# Patient Record
Sex: Female | Born: 1955 | Race: Black or African American | Hispanic: No | Smoking: Never smoker
Health system: Southern US, Community
[De-identification: ages and names within clinical notes are randomized; demographics above are authoritative.]

## PROBLEM LIST (undated history)

## (undated) DIAGNOSIS — D649 Anemia, unspecified: Secondary | ICD-10-CM

## (undated) DIAGNOSIS — T7840XA Allergy, unspecified, initial encounter: Secondary | ICD-10-CM

## (undated) DIAGNOSIS — I10 Essential (primary) hypertension: Secondary | ICD-10-CM

## (undated) DIAGNOSIS — K5900 Constipation, unspecified: Secondary | ICD-10-CM

## (undated) DIAGNOSIS — M1990 Unspecified osteoarthritis, unspecified site: Secondary | ICD-10-CM

## (undated) DIAGNOSIS — K219 Gastro-esophageal reflux disease without esophagitis: Secondary | ICD-10-CM

## (undated) HISTORY — PX: FOOT SURGERY: SHX648

## (undated) HISTORY — PX: TUBAL LIGATION: SHX77

## (undated) HISTORY — DX: Constipation, unspecified: K59.00

## (undated) HISTORY — PX: ABDOMINAL HYSTERECTOMY: SHX81

## (undated) HISTORY — PX: TONSILLECTOMY: SUR1361

## (undated) HISTORY — PX: COLONOSCOPY: SHX174

## (undated) HISTORY — DX: Gastro-esophageal reflux disease without esophagitis: K21.9

## (undated) HISTORY — PX: UPPER GASTROINTESTINAL ENDOSCOPY: SHX188

## (undated) HISTORY — DX: Unspecified osteoarthritis, unspecified site: M19.90

## (undated) HISTORY — DX: Anemia, unspecified: D64.9

## (undated) HISTORY — DX: Allergy, unspecified, initial encounter: T78.40XA

## (undated) HISTORY — DX: Essential (primary) hypertension: I10

---

## 2007-05-03 ENCOUNTER — Ambulatory Visit: Payer: Self-pay | Admitting: Internal Medicine

## 2007-05-07 ENCOUNTER — Ambulatory Visit: Payer: Self-pay | Admitting: *Deleted

## 2007-06-25 ENCOUNTER — Ambulatory Visit: Payer: Self-pay | Admitting: Internal Medicine

## 2007-07-16 ENCOUNTER — Ambulatory Visit: Payer: Self-pay | Admitting: Internal Medicine

## 2007-07-16 LAB — CONVERTED CEMR LAB
Amphetamine Screen, Ur: NEGATIVE
Benzodiazepines.: NEGATIVE
CO2: 22 meq/L (ref 19–32)
Calcium: 9 mg/dL (ref 8.4–10.5)
Chloride: 105 meq/L (ref 96–112)
Cholesterol: 144 mg/dL (ref 0–200)
Cocaine Metabolites: NEGATIVE
Creatinine, Ser: 0.86 mg/dL (ref 0.40–1.20)
Eosinophils Relative: 2 % (ref 0–5)
Glucose, Bld: 100 mg/dL — ABNORMAL HIGH (ref 70–99)
HCT: 39.7 % (ref 36.0–46.0)
Hemoglobin: 13.4 g/dL (ref 12.0–15.0)
Lymphocytes Relative: 18 % (ref 12–46)
Lymphs Abs: 1.8 10*3/uL (ref 0.7–4.0)
Marijuana Metabolite: NEGATIVE
Methadone: NEGATIVE
Monocytes Absolute: 0.6 10*3/uL (ref 0.1–1.0)
Neutro Abs: 7.6 10*3/uL (ref 1.7–7.7)
Sodium: 138 meq/L (ref 135–145)
Total Bilirubin: 0.7 mg/dL (ref 0.3–1.2)
Total Protein: 7.4 g/dL (ref 6.0–8.3)
Triglycerides: 80 mg/dL (ref <150)
VLDL: 16 mg/dL (ref 0–40)
WBC: 10.2 10*3/uL (ref 4.0–10.5)

## 2007-07-30 ENCOUNTER — Ambulatory Visit: Payer: Self-pay | Admitting: Family Medicine

## 2007-07-30 LAB — CONVERTED CEMR LAB
T3, Total: 127 ng/dL (ref 80.0–204.0)
T4, Total: 6.4 ug/dL (ref 5.0–12.5)
TSH: 2.406 microintl units/mL (ref 0.350–5.50)

## 2012-09-11 ENCOUNTER — Ambulatory Visit (HOSPITAL_COMMUNITY)
Admission: RE | Admit: 2012-09-11 | Discharge: 2012-09-11 | Disposition: A | Discharge status: Home / Self Care | Payer: No Typology Code available for payment source | Source: Ambulatory Visit | Attending: Internal Medicine | Admitting: Internal Medicine

## 2012-09-11 ENCOUNTER — Ambulatory Visit: Payer: No Typology Code available for payment source | Attending: Family Medicine | Admitting: Internal Medicine

## 2012-09-11 VITALS — BP 144/81 | HR 61 | Temp 97.9°F | Resp 18 | Wt 184.4 lb

## 2012-09-11 DIAGNOSIS — R05 Cough: Secondary | ICD-10-CM

## 2012-09-11 DIAGNOSIS — R053 Chronic cough: Secondary | ICD-10-CM | POA: Insufficient documentation

## 2012-09-11 DIAGNOSIS — R059 Cough, unspecified: Secondary | ICD-10-CM

## 2012-09-11 DIAGNOSIS — R0602 Shortness of breath: Secondary | ICD-10-CM | POA: Insufficient documentation

## 2012-09-11 DIAGNOSIS — M659 Synovitis and tenosynovitis, unspecified: Secondary | ICD-10-CM

## 2012-09-11 DIAGNOSIS — R609 Edema, unspecified: Secondary | ICD-10-CM

## 2012-09-11 DIAGNOSIS — R600 Localized edema: Secondary | ICD-10-CM

## 2012-09-11 DIAGNOSIS — M6580 Other synovitis and tenosynovitis, unspecified site: Secondary | ICD-10-CM

## 2012-09-11 HISTORY — DX: Chronic cough: R05.3

## 2012-09-11 HISTORY — DX: Localized edema: R60.0

## 2012-09-11 HISTORY — DX: Edema, unspecified: R60.9

## 2012-09-11 LAB — CBC WITH DIFFERENTIAL/PLATELET
Basophils Absolute: 0 10*3/uL (ref 0.0–0.1)
Eosinophils Relative: 1 % (ref 0–5)
Lymphocytes Relative: 33 % (ref 12–46)
Lymphs Abs: 2.1 10*3/uL (ref 0.7–4.0)
MCV: 79.2 fL (ref 78.0–100.0)
Neutro Abs: 3.8 10*3/uL (ref 1.7–7.7)
Neutrophils Relative %: 59 % (ref 43–77)
Platelets: 337 10*3/uL (ref 150–400)
RBC: 4.77 MIL/uL (ref 3.87–5.11)
RDW: 12.4 % (ref 11.5–15.5)
WBC: 6.4 10*3/uL (ref 4.0–10.5)

## 2012-09-11 LAB — COMPREHENSIVE METABOLIC PANEL
ALT: 30 U/L (ref 0–35)
AST: 24 U/L (ref 0–37)
CO2: 24 mEq/L (ref 19–32)
Calcium: 9.2 mg/dL (ref 8.4–10.5)
Chloride: 106 mEq/L (ref 96–112)
Creat: 0.95 mg/dL (ref 0.50–1.10)
Sodium: 138 mEq/L (ref 135–145)
Total Protein: 7.4 g/dL (ref 6.0–8.3)

## 2012-09-11 LAB — TSH: TSH: 2.51 u[IU]/mL (ref 0.350–4.500)

## 2012-09-11 MED ORDER — FUROSEMIDE 40 MG PO TABS: 40.0000 mg | ORAL_TABLET | Freq: Every day | ORAL | Status: DC

## 2012-09-11 MED ORDER — AZITHROMYCIN 250 MG PO TABS: ORAL_TABLET | ORAL | Status: DC

## 2012-09-11 MED ORDER — MELOXICAM 15 MG PO TABS: 15.0000 mg | ORAL_TABLET | Freq: Every day | ORAL | Status: DC

## 2012-09-11 NOTE — Progress Notes (Signed)
  Subjective:    Patient ID: Lindsey Suarez, female    DOB: [birth year], 57 y.o.   MRN: [id number]  HPI  57 year old female who [facility]. The patient has multiple complaints. Firstly the patient has had a cough since January. She has been taking over-the-counter Mucinex and delsom for her symptoms. The patient also complains of bilateral lower extremity swelling and has gained about 15 pounds since October of last year. The patient was being 169 in October of last yesterday weighs 184 pounds. Patient denies any chest pain but does complain of dyspnea on exertion. She also complains of elbow pain in her right arm. The patient states that any rotation of the  elbow elicits sharp shooting pain along the lateral aspect of her right forearm.  Review of Systems As in history of present illness  Past medical history Chronic sinusitis  Past surgical history Tubal ligation Vaginal hysterectomy  Social history Nonfocal, drinks occasionally unemployed  Family history Mother has breast cancer Aunt has breast cancer      Objective:   Physical Exam  General: NAD, resting comfortably in bed  Eyes: PEERLA EOMI  ENT: mucous membranes moist, patient very hard of hearing  Neck: supple w/o JVD  Cardiovascular: RRR w/o MRG  Respiratory: CTA B  Abdomen: soft, nt, nd, bs+  Skin: no rash nor lesion  Musculoskeletal: MAE, full ROM all 4 extremities  Psychiatric: normal tone and affect  Neurologic: AAOx3, grossly non-focal         Assessment & Plan:  Shortness of breath, peripheral edema-this is a new diagnosis we will obtain a chest x-ray, BNP, BMP, start the patient on Lasix 40 mg a day and have the patient follow up in 2 weeks for repeat BMP We'll also refer the patient for a 2-D echo  Family history of breast cancer we'll schedule the patient for a mammogram   Chronic cough could be related to volume overload, and empirically prescribed  a five-day course of Z-Pak  Right elbow tenosynovitis Prescribed the patient meloxicam

## 2012-09-11 NOTE — Progress Notes (Signed)
Patient here to establish care Has had a cough since december

## 2012-09-12 LAB — PRO B NATRIURETIC PEPTIDE: Pro B Natriuretic peptide (BNP): 26.35 pg/mL (ref <126)

## 2012-09-16 ENCOUNTER — Encounter: Payer: Self-pay | Admitting: Family Medicine

## 2012-09-17 ENCOUNTER — Telehealth: Payer: Self-pay | Admitting: *Deleted

## 2012-09-25 ENCOUNTER — Ambulatory Visit: Payer: No Typology Code available for payment source | Attending: Family Medicine | Admitting: Internal Medicine

## 2012-09-25 VITALS — BP 142/90 | HR 81 | Temp 98.6°F | Resp 16 | Ht 62.0 in | Wt 182.5 lb

## 2012-09-25 DIAGNOSIS — M6580 Other synovitis and tenosynovitis, unspecified site: Secondary | ICD-10-CM | POA: Insufficient documentation

## 2012-09-25 DIAGNOSIS — R609 Edema, unspecified: Secondary | ICD-10-CM | POA: Insufficient documentation

## 2012-09-25 DIAGNOSIS — R600 Localized edema: Secondary | ICD-10-CM

## 2012-09-25 DIAGNOSIS — R059 Cough, unspecified: Secondary | ICD-10-CM | POA: Insufficient documentation

## 2012-09-25 DIAGNOSIS — R05 Cough: Secondary | ICD-10-CM

## 2012-09-25 MED ORDER — GUAIFENESIN ER 600 MG PO TB12: 1200.0000 mg | ORAL_TABLET | Freq: Two times a day (BID) | ORAL | Status: DC | PRN

## 2012-09-25 NOTE — Patient Instructions (Signed)

## 2012-09-25 NOTE — Progress Notes (Signed)
Pt is here for a follow up and needing refills on meds Also c/o persistent dry cough w/occasioanl clear phlegm and fluid build up She is alert and oriented w/no signs of acute distress.... Rmeza, CMA

## 2012-09-25 NOTE — Progress Notes (Signed)
Patient ID: Lindsey Suarez, female   DOB: [birth year], 56 y.o.   MRN: [id number]  CC: Followup visit  HPI: 57 year old female with past medical history for arthritis and lower extremity edema, chronic cough who presents to the clinic for followup and review of blood tests. Patient has chronic cough and was on a Z-Pak most recently with no significant relief. Patient reports no associated shortness of breath, fever or chills. No complaints of wheezing.  Allergies  Allergen Reactions  . Sulfa Antibiotics    Past Medical History  Diagnosis Date  . Arthritis    Current Outpatient Prescriptions on File Prior to Visit  Medication Sig Dispense Refill  . furosemide (LASIX) 40 MG tablet Take 1 tablet (40 mg total) by mouth daily.  30 tablet  3  . meloxicam (MOBIC) 15 MG tablet Take 1 tablet (15 mg total) by mouth daily.  30 tablet  0  . azithromycin (ZITHROMAX) 250 MG tablet Z-Pak  6 tablet  0   No current facility-administered medications on file prior to visit.   History reviewed. No pertinent family history. History   Social History  . Marital Status: Legally Separated    Spouse Name: N/A    Number of Children: N/A  . Years of Education: N/A   Occupational History  . Not on file.   Social History Main Topics  . Smoking status: Never Smoker   . Smokeless tobacco: Not on file  . Alcohol Use: Not on file  . Drug Use: Not on file  . Sexually Active: Not on file   Other Topics Concern  . Not on file   Social History Narrative  . No narrative on file   Family medical history significant for HTN, HLD  Review of Systems  Constitutional: Negative for fever, chills, diaphoresis, activity change, appetite change and fatigue.  HENT: Negative for ear pain, nosebleeds, congestion, facial swelling, rhinorrhea, neck pain, neck stiffness and ear discharge.   Eyes: Negative for pain, discharge, redness, itching and visual disturbance.  Respiratory: Positive for cough, negative for  choking, chest tightness, shortness of breath, wheezing and stridor.   Cardiovascular: Negative for chest pain, palpitations and leg swelling.  Gastrointestinal: Negative for abdominal distention.  Genitourinary: Negative for dysuria, urgency, frequency, hematuria, flank pain, decreased urine volume, difficulty urinating and dyspareunia.  Musculoskeletal: Negative for back pain, joint swelling, arthralgias and gait problem.  Neurological: Negative for dizziness, tremors, seizures, syncope, facial asymmetry, speech difficulty, weakness, light-headedness, numbness and headaches.  Hematological: Negative for adenopathy. Does not bruise/bleed easily.  Psychiatric/Behavioral: Negative for hallucinations, behavioral problems, confusion, dysphoric mood, decreased concentration and agitation.    Objective:   Filed Vitals:   09/25/12 0928  BP: 142/90  Pulse: 81  Temp: 98.6 F (37 C)  Resp: 16    Physical Exam  Constitutional: Appears well-developed and well-nourished. No distress.  HENT: Normocephalic. External right and left ear normal. Oropharynx is clear and moist.  Eyes: Conjunctivae and EOM are normal. PERRLA, no scleral icterus.  Neck: Normal ROM. Neck supple. No JVD. No tracheal deviation. No thyromegaly.  CVS: RRR, S1/S2 +, no murmurs, no gallops, no carotid bruit.  Pulmonary: Effort and breath sounds normal, no stridor, rhonchi, wheezes, rales.  Abdominal: Soft. BS +,  no distension, tenderness, rebound or guarding.  Musculoskeletal: Normal range of motion. No edema and no tenderness.  Lymphadenopathy: No lymphadenopathy noted, cervical, inguinal. Neuro: Alert. Normal reflexes, muscle tone coordination. No cranial nerve deficit. Skin: Skin is warm and dry. No rash noted.  Not diaphoretic. No erythema. No pallor.  Psychiatric: Normal mood and affect. Behavior, judgment, thought content normal.   Lab Results  Component Value Date   WBC 6.4 09/11/2012   HGB 13.3 09/11/2012   HCT 37.8  09/11/2012   MCV 79.2 09/11/2012   PLT 337 09/11/2012   Lab Results  Component Value Date   CREATININE 0.95 09/11/2012   BUN 7 09/11/2012   NA 138 09/11/2012   K 4.8 09/11/2012   CL 106 09/11/2012   CO2 24 09/11/2012    No results found for this basename: HGBA1C   Lipid Panel     Component Value Date/Time   CHOL 144 07/16/2007 2021   TRIG 80 07/16/2007 2021   HDL 56 07/16/2007 2021   CHOLHDL 2.6 Ratio 07/16/2007 2021   VLDL 16 07/16/2007 2021   LDLCALC 72 07/16/2007 2021       Assessment and plan:   Patient Active Problem List   Diagnosis Date Noted  . 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 Peripheral edema - Continue Lasix  - BNP WNL 09/11/2012 09/11/2012    Health maintenance  - TSH WNL in 09/11/2012   . Chronic cough - Will prescribe Mucinex 600 mg twice daily as needed to see if patient gets any symptomatic relief  09/11/2012

## 2012-10-25 ENCOUNTER — Ambulatory Visit: Payer: No Typology Code available for payment source | Attending: Family Medicine | Admitting: Family Medicine

## 2012-10-25 ENCOUNTER — Ambulatory Visit (HOSPITAL_COMMUNITY)
Admission: RE | Admit: 2012-10-25 | Discharge: 2012-10-25 | Disposition: A | Discharge status: Home / Self Care | Payer: No Typology Code available for payment source | Source: Ambulatory Visit | Attending: Family Medicine | Admitting: Family Medicine

## 2012-10-25 VITALS — BP 146/81 | HR 58 | Temp 98.4°F | Resp 18 | Ht 62.0 in | Wt 181.6 lb

## 2012-10-25 DIAGNOSIS — S2239XA Fracture of one rib, unspecified side, initial encounter for closed fracture: Secondary | ICD-10-CM | POA: Insufficient documentation

## 2012-10-25 DIAGNOSIS — G8929 Other chronic pain: Secondary | ICD-10-CM

## 2012-10-25 DIAGNOSIS — R1013 Epigastric pain: Secondary | ICD-10-CM

## 2012-10-25 DIAGNOSIS — R079 Chest pain, unspecified: Secondary | ICD-10-CM | POA: Insufficient documentation

## 2012-10-25 DIAGNOSIS — J9819 Other pulmonary collapse: Secondary | ICD-10-CM | POA: Insufficient documentation

## 2012-10-25 DIAGNOSIS — X58XXXA Exposure to other specified factors, initial encounter: Secondary | ICD-10-CM | POA: Insufficient documentation

## 2012-10-25 DIAGNOSIS — R059 Cough, unspecified: Secondary | ICD-10-CM | POA: Insufficient documentation

## 2012-10-25 DIAGNOSIS — R609 Edema, unspecified: Secondary | ICD-10-CM

## 2012-10-25 DIAGNOSIS — S2231XA Fracture of one rib, right side, initial encounter for closed fracture: Secondary | ICD-10-CM

## 2012-10-25 DIAGNOSIS — M7989 Other specified soft tissue disorders: Secondary | ICD-10-CM | POA: Insufficient documentation

## 2012-10-25 DIAGNOSIS — R05 Cough: Secondary | ICD-10-CM | POA: Insufficient documentation

## 2012-10-25 DIAGNOSIS — R0602 Shortness of breath: Secondary | ICD-10-CM | POA: Insufficient documentation

## 2012-10-25 HISTORY — DX: Fracture of one rib, unspecified side, initial encounter for closed fracture: S22.39XA

## 2012-10-25 HISTORY — DX: Chest pain, unspecified: R07.9

## 2012-10-25 MED ORDER — IOHEXOL 350 MG/ML SOLN
100.0000 mL | Freq: Once | INTRAVENOUS | Status: AC | PRN
  Administered 2012-10-25: 100 mL via INTRAVENOUS

## 2012-10-25 MED ORDER — NAPROXEN 500 MG PO TABS: ORAL_TABLET | ORAL | Status: DC

## 2012-10-25 MED ORDER — HYDROCHLOROTHIAZIDE 12.5 MG PO TABS: 12.5000 mg | ORAL_TABLET | Freq: Every day | ORAL | Status: DC

## 2012-10-25 MED ORDER — HYDROCODONE-ACETAMINOPHEN 5-325 MG PO TABS: 1.0000 | ORAL_TABLET | Freq: Three times a day (TID) | ORAL | Status: DC | PRN

## 2012-10-25 NOTE — Patient Instructions (Addendum)
DASH Diet The DASH diet stands for "Dietary Approaches to Stop Hypertension." It is a healthy eating plan that has been shown to reduce high blood pressure (hypertension) in as little as 14 days, while also possibly providing other significant health benefits. These other health benefits include reducing the risk of breast cancer after menopause and reducing the risk of type 2 diabetes, heart disease, colon cancer, and stroke. Health benefits also include weight loss and slowing kidney failure in patients with chronic kidney disease.  DIET GUIDELINES  Limit salt (sodium). Your diet should contain less than 1500 mg of sodium daily.  Limit refined or processed carbohydrates. Your diet should include mostly whole grains. Desserts and added sugars should be used sparingly.  Include small amounts of heart-healthy fats. These types of fats include nuts, oils, and tub margarine. Limit saturated and trans fats. These fats have been shown to be harmful in the body. CHOOSING FOODS  The following food groups are based on a 2000 calorie diet. See your Registered Dietitian for individual calorie needs. Grains and Grain Products (6 to 8 servings daily)  Eat More Often: Whole-wheat bread, brown rice, whole-grain or wheat pasta, quinoa, popcorn without added fat or salt (air popped).  Eat Less Often: White bread, white pasta, white rice, cornbread. Vegetables (4 to 5 servings daily)  Eat More Often: Fresh, frozen, and canned vegetables. Vegetables may be raw, steamed, roasted, or grilled with a minimal amount of fat.  Eat Less Often/Avoid: Creamed or fried vegetables. Vegetables in a cheese sauce. Fruit (4 to 5 servings daily)  Eat More Often: All fresh, canned (in natural juice), or frozen fruits. Dried fruits without added sugar. One hundred percent fruit juice ( cup [237 mL] daily).  Eat Less Often: Dried fruits with added sugar. Canned fruit in light or heavy syrup. Lean Meats, Fish, and Poultry (2  servings or less daily. One serving is 3 to 4 oz [85-114 g]).  Eat More Often: Ninety percent or leaner ground beef, tenderloin, sirloin. Round cuts of beef, chicken breast, turkey breast. All fish. Grill, bake, or broil your meat. Nothing should be fried.  Eat Less Often/Avoid: Fatty cuts of meat, turkey, or chicken leg, thigh, or wing. Fried cuts of meat or fish. Dairy (2 to 3 servings)  Eat More Often: Low-fat or fat-free milk, low-fat plain or light yogurt, reduced-fat or part-skim cheese.  Eat Less Often/Avoid: Milk (whole, 2%).Whole milk yogurt. Full-fat cheeses. Nuts, Seeds, and Legumes (4 to 5 servings per week)  Eat More Often: All without added salt.  Eat Less Often/Avoid: Salted nuts and seeds, canned beans with added salt. Fats and Sweets (limited)  Eat More Often: Vegetable oils, tub margarines without trans fats, sugar-free gelatin. Mayonnaise and salad dressings.  Eat Less Often/Avoid: Coconut oils, palm oils, butter, stick margarine, cream, half and half, cookies, candy, pie. FOR MORE INFORMATION The Dash Diet Eating Plan: www.dashdiet.org Document Released: 03/24/2011 Document Revised: 06/27/2011 Document Reviewed: 03/24/2011 [employer] Patient Information 2014 [employer]. Hypertension As your heart beats, it forces blood through your arteries. This force is your blood pressure. If the pressure is too high, it is called hypertension (HTN) or high blood pressure. HTN is dangerous because you may have it and not know it. High blood pressure may mean that your heart has to work harder to pump blood. Your arteries may be narrow or stiff. The extra work puts you at risk for heart disease, stroke, and other problems.  Blood pressure consists of two numbers, a   higher number over a lower, 110/72, for example. It is stated as "110 over 72." The ideal is below 120 for the top number (systolic) and under 80 for the bottom (diastolic). Write down your blood pressure today. You  should pay close attention to your blood pressure if you have certain conditions such as:  Heart failure.  Prior heart attack.  Diabetes  Chronic kidney disease.  Prior stroke.  Multiple risk factors for heart disease. To see if you have HTN, your blood pressure should be measured while you are seated with your arm held at the level of the heart. It should be measured at least twice. A one-time elevated blood pressure reading (especially in the Emergency Department) does not mean that you need treatment. There may be conditions in which the blood pressure is different between your right and left arms. It is important to see your caregiver soon for a recheck. Most people have essential hypertension which means that there is not a specific cause. This type of high blood pressure may be lowered by changing lifestyle factors such as:  Stress.  Smoking.  Lack of exercise.  Excessive weight.  Drug/tobacco/alcohol use.  Eating less salt. Most people do not have symptoms from high blood pressure until it has caused damage to the body. Effective treatment can often prevent, delay or reduce that damage. TREATMENT  When a cause has been identified, treatment for high blood pressure is directed at the cause. There are a large number of medications to treat HTN. These fall into several categories, and your caregiver will help you select the medicines that are best for you. Medications may have side effects. You should review side effects with your caregiver. If your blood pressure stays high after you have made lifestyle changes or started on medicines,   Your medication(s) may need to be changed.  Other problems may need to be addressed.  Be certain you understand your prescriptions, and know how and when to take your medicine.  Be sure to follow up with your caregiver within the time frame advised (usually within two weeks) to have your blood pressure rechecked and to review your  medications.  If you are taking more than one medicine to lower your blood pressure, make sure you know how and at what times they should be taken. Taking two medicines at the same time can result in blood pressure that is too low. SEEK IMMEDIATE MEDICAL CARE IF:  You develop a severe headache, blurred or changing vision, or confusion.  You have unusual weakness or numbness, or a faint feeling.  You have severe chest or abdominal pain, vomiting, or breathing problems. MAKE SURE YOU:   Understand these instructions.  Will watch your condition.  Will get help right away if you are not doing well or get worse. Document Released: 04/04/2005 Document Revised: 06/27/2011 Document Reviewed: 11/23/2007 [facility] Patient Information 2014 [place], [place]. Pleurisy Pleurisy is an irritation (inflammation) and puffiness (swelling) of the lining of the lungs. It is often caused by an existing infection or disease. It can be hard to breathe and hurt to breathe. Coughing or deep breathing will make it hurt more. HOME CARE  Only take medicine as told by your doctor.  Take your medicines (antibiotics) as told. Finish them even if you start to feel better.  Use a cool mist vaporizer to loosen mucus. GET HELP RIGHT AWAY IF:   Your lips, fingernails, or toenails are blue or dark.  You cough up blood.  You  have a hard time breathing.  Your pain is not controlled with medicine or it lasts for more than 1 week.  Your pain spreads (radiates) into your neck, arms, or jaw.  You are short of breath or wheezing.  You develop a fever, rash, throw up (vomit), or faint.  Your pain gets worse.  You cough up more yellowish white (pus-like) mucus. MAKE SURE YOU:   Understand these instructions.  Will watch your condition.  Will get help right away if you are not doing well or get worse. Document Released: 03/17/2008 Document Revised: 06/27/2011 Document Reviewed: 07/02/2009 [facility] Patient  Information 2014 [place], [place]. Chest Pain (Nonspecific) It is often hard to give a specific diagnosis for the cause of chest pain. There is always a chance that your pain could be related to something serious, such as a heart attack or a blood clot in the lungs. You need to follow up with your caregiver for further evaluation. CAUSES   Heartburn.  Pneumonia or bronchitis.  Anxiety or stress.  Inflammation around your heart (pericarditis) or lung (pleuritis or pleurisy).  A blood clot in the lung.  A collapsed lung (pneumothorax). It can develop suddenly on its own (spontaneous pneumothorax) or from injury (trauma) to the chest.  Shingles infection (herpes zoster virus). The chest wall is composed of bones, muscles, and cartilage. Any of these can be the source of the pain.  The bones can be bruised by injury.  The muscles or cartilage can be strained by coughing or overwork.  The cartilage can be affected by inflammation and become sore (costochondritis). DIAGNOSIS  Lab tests or other studies, such as X-rays, electrocardiography, stress testing, or cardiac imaging, may be needed to find the cause of your pain.  TREATMENT   Treatment depends on what may be causing your chest pain. Treatment may include:  Acid blockers for heartburn.  Anti-inflammatory medicine.  Pain medicine for inflammatory conditions.  Antibiotics if an infection is present.  You may be advised to change lifestyle habits. This includes stopping smoking and avoiding alcohol, caffeine, and chocolate.  You may be advised to keep your head raised (elevated) when sleeping. This reduces the chance of acid going backward from your stomach into your esophagus.  Most of the time, nonspecific chest pain will improve within 2 to 3 days with rest and mild pain medicine. HOME CARE INSTRUCTIONS   If antibiotics were prescribed, take your antibiotics as directed. Finish them even if you start to feel better.  For  the next few days, avoid physical activities that bring on chest pain. Continue physical activities as directed.  Do not smoke.  Avoid drinking alcohol.  Only take over-the-counter or prescription medicine for pain, discomfort, or fever as directed by your caregiver.  Follow your caregiver's suggestions for further testing if your chest pain does not go away.  Keep any follow-up appointments you made. If you do not go to an appointment, you could develop lasting (chronic) problems with pain. If there is any problem keeping an appointment, you must call to reschedule. SEEK MEDICAL CARE IF:   You think you are having problems from the medicine you are taking. Read your medicine instructions carefully.  Your chest pain does not go away, even after treatment.  You develop a rash with blisters on your chest. SEEK IMMEDIATE MEDICAL CARE IF:   You have increased chest pain or pain that spreads to your arm, neck, jaw, back, or abdomen.  You develop shortness of breath, an increasing  cough, or you are coughing up blood.  You have severe back or abdominal pain, feel nauseous, or vomit.  You develop severe weakness, fainting, or chills.  You have a fever. THIS IS AN EMERGENCY. Do not wait to see if the pain will go away. Get medical help at once. Call your local emergency services (911 in [place] Do not drive yourself to the hospital. MAKE SURE YOU:   Understand these instructions.  Will watch your condition.  Will get help right away if you are not doing well or get worse. Document Released: 01/12/2005 Document Revised: 06/27/2011 Document Reviewed: 11/08/2007 [facility] Patient Information 2014 [place], [place].  Rib Fracture The ribs are like a cage that goes around your upper chest. The ribs protect your lungs. Your ribs move when you breathe, so it hurts if a rib is broken. HOME CARE  Avoid activities that cause pain to the injured area. Protect your injured area.  Eat a normal,  healthy diet.  Drink enough fluids to keep your pee (urine) clear or pale yellow.  Take deep breaths many times a day. Cough many times a day while hugging a pillow.  Do not wear a rib belt or binder on the chest unless told by your doctor. Rib belts or binders do not allow you to breathe deeply.  Only take medicine as told by your doctor. GET HELP RIGHT AWAY IF:   You have a fever.  You cannot stop coughing or cough up thick or bloody spit (mucus).  You have trouble breathing.  You feel sick to your stomach (nauseous), throw up (vomit), or have belly (abdominal) pain.  Your pain gets worse and medicine does not help. MAKE SURE YOU:   Understand these instructions.  Will watch this condition.  Will get help right away if you are not doing well or get worse. Document Released: 01/12/2008 Document Revised: 06/27/2011 Document Reviewed: 01/12/2008 [facility] Patient Information 2014 [place], [place].

## 2012-10-25 NOTE — Progress Notes (Signed)
Patient ID: Lindsey Suarez, female   DOB: 1955-10-25, 57 y.o.   MRN: [id number]  CC: follow up   HPI: Pt reports that she has having pain in right side of chest with having deep breaths.  Pt says she is not able to lie recumbent without pain.  Pt is having shortness of breath. She says that the cough is getting a little better but the rib pain and chest pain is not getting much better.   Pt is having body aches.  Pt says that she is having persistent swelling in her legs and ankles and arms even after taking the lasix.   Allergies  Allergen Reactions  . Sulfa Antibiotics    Past Medical History  Diagnosis Date  . Arthritis    Current Outpatient Prescriptions on File Prior to Visit  Medication Sig Dispense Refill  . azithromycin (ZITHROMAX) 250 MG tablet Z-Pak  6 tablet  0  . furosemide (LASIX) 40 MG tablet Take 1 tablet (40 mg total) by mouth daily.  30 tablet  3  . guaiFENesin (MUCINEX) 600 MG 12 hr tablet Take 2 tablets (1,200 mg total) by mouth 2 (two) times daily as needed for congestion.  60 tablet  0  . meloxicam (MOBIC) 15 MG tablet Take 1 tablet (15 mg total) by mouth daily.  30 tablet  0   No current facility-administered medications on file prior to visit.   History reviewed. No pertinent family history. History   Social History  . Marital Status: Legally Separated    Spouse Name: N/A    Number of Children: N/A  . Years of Education: N/A   Occupational History  . Not on file.   Social History Main Topics  . Smoking status: Never Smoker   . Smokeless tobacco: Not on file  . Alcohol Use: Not on file  . Drug Use: Not on file  . Sexually Active: Not on file   Other Topics Concern  . Not on file   Social History Narrative  . No narrative on file    Review of Systems  Constitutional: Negative for fever, chills, diaphoresis, activity change, appetite change and fatigue.  HENT: Negative for ear pain, nosebleeds, congestion, facial swelling, rhinorrhea, neck pain,  neck stiffness and ear discharge.   Eyes: Negative for pain, discharge, redness, itching and visual disturbance.  Respiratory: Negative for cough, choking, chest tightness, shortness of breath, wheezing and stridor.   Cardiovascular: Negative for chest pain, palpitations and leg swelling.  Gastrointestinal: Negative for abdominal distention.  Genitourinary: Negative for dysuria, urgency, frequency, hematuria, flank pain, decreased urine volume, difficulty urinating and dyspareunia.  Musculoskeletal: Negative for back pain, joint swelling, arthralgias and gait problem.  Neurological: Negative for dizziness, tremors, seizures, syncope, facial asymmetry, speech difficulty, weakness, light-headedness, numbness and headaches.  Hematological: Negative for adenopathy. Does not bruise/bleed easily.  Psychiatric/Behavioral: Negative for hallucinations, behavioral problems, confusion, dysphoric mood, decreased concentration and agitation.    Objective:   Filed Vitals:   10/25/12 0917  BP: 146/81  Pulse: 58  Temp: 98.4 F (36.9 C)  Resp: 18    Physical Exam  Constitutional: Appears well-developed and well-nourished. No distress.  HENT: Normocephalic. External right and left ear normal. Oropharynx is clear and moist.  Eyes: Conjunctivae and EOM are normal. PERRLA, no scleral icterus.  Neck: Normal ROM. Neck supple. No JVD. No tracheal deviation. No thyromegaly.  CVS: RRR, S1/S2 +, no murmurs, no gallops, no carotid bruit.  Pulmonary: Effort and breath sounds normal, no stridor,  rhonchi, wheezes, rales. Severe tenderness of right chest wall with palpation.  Abdominal: Soft. BS +,  no distension, tenderness, rebound or guarding.  Musculoskeletal: Normal range of motion. No edema and no tenderness.  Lymphadenopathy: No lymphadenopathy noted, cervical, inguinal. Neuro: Alert. Normal reflexes, muscle tone coordination. No cranial nerve deficit. Skin: Skin is warm and dry. No rash noted. Not  diaphoretic. No erythema. No pallor.  Psychiatric: Normal mood and affect. Behavior, judgment, thought content normal.   Lab Results  Component Value Date   WBC 6.4 09/11/2012   HGB 13.3 09/11/2012   HCT 37.8 09/11/2012   MCV 79.2 09/11/2012   PLT 337 09/11/2012   Lab Results  Component Value Date   CREATININE 0.95 09/11/2012   BUN 7 09/11/2012   NA 138 09/11/2012   K 4.8 09/11/2012   CL 106 09/11/2012   CO2 24 09/11/2012    No results found for this basename: HGBA1C   Lipid Panel     Component Value Date/Time   CHOL 144 07/16/2007 2021   TRIG 80 07/16/2007 2021   HDL 56 07/16/2007 2021   CHOLHDL 2.6 Ratio 07/16/2007 2021   VLDL 16 07/16/2007 2021   LDLCALC 72 07/16/2007 2021      Assessment and plan:   Patient Active Problem List   Diagnosis Date Noted  . Tenosynovitis of elbow 09/11/2012  . Peripheral edema 09/11/2012  . Chronic cough 09/11/2012   Persistent pleuritic chest pain - send for CT chest : Results reveal patient has an acute rib fracture on the right side. -Naproxen 500 mg by mouth twice a day prescribed and Vicodin 5 mg when necessary severe pain Followup within 2 weeks to recheck.  Untreated Hypertension Trial of HCTZ 12.5 mg po daily  RTC in 2 weeks  The patient was given clear instructions to go to ER or return to medical center if symptoms don't improve, worsen or new problems develop.  The patient verbalized understanding.  The patient was told to call to get any lab results if not heard anything in the next week.    Rodney Langton, MD, CDE, FAAFP [facility] [place], [place]

## 2012-10-25 NOTE — Progress Notes (Signed)
Patient here for follow up Complains of still having cough with pain under ribs Achy Pain in right arm Bilateral swelling in feet and ankles

## 2012-10-25 NOTE — Progress Notes (Signed)
Pt scheduled for CT chest per radiology after d/c. Informed not to eat or drink

## 2012-11-12 ENCOUNTER — Ambulatory Visit: Payer: No Typology Code available for payment source | Admitting: Family Medicine

## 2012-11-13 ENCOUNTER — Encounter: Payer: Self-pay | Admitting: Family Medicine

## 2012-11-13 ENCOUNTER — Ambulatory Visit: Payer: No Typology Code available for payment source | Attending: Family Medicine | Admitting: Family Medicine

## 2012-11-13 VITALS — BP 157/78 | HR 67 | Temp 98.7°F | Ht 62.0 in | Wt 181.0 lb

## 2012-11-13 DIAGNOSIS — R52 Pain, unspecified: Secondary | ICD-10-CM

## 2012-11-13 DIAGNOSIS — K219 Gastro-esophageal reflux disease without esophagitis: Secondary | ICD-10-CM

## 2012-11-13 DIAGNOSIS — M1990 Unspecified osteoarthritis, unspecified site: Secondary | ICD-10-CM

## 2012-11-13 DIAGNOSIS — M659 Unspecified synovitis and tenosynovitis, unspecified site: Secondary | ICD-10-CM | POA: Insufficient documentation

## 2012-11-13 DIAGNOSIS — M6580 Other synovitis and tenosynovitis, unspecified site: Secondary | ICD-10-CM

## 2012-11-13 DIAGNOSIS — S2231XD Fracture of one rib, right side, subsequent encounter for fracture with routine healing: Secondary | ICD-10-CM

## 2012-11-13 DIAGNOSIS — Z0000 Encounter for general adult medical examination without abnormal findings: Secondary | ICD-10-CM

## 2012-11-13 DIAGNOSIS — I10 Essential (primary) hypertension: Secondary | ICD-10-CM | POA: Insufficient documentation

## 2012-11-13 DIAGNOSIS — M129 Arthropathy, unspecified: Secondary | ICD-10-CM

## 2012-11-13 DIAGNOSIS — IMO0001 Reserved for inherently not codable concepts without codable children: Secondary | ICD-10-CM

## 2012-11-13 DIAGNOSIS — M65929 Unspecified synovitis and tenosynovitis, unspecified upper arm: Secondary | ICD-10-CM

## 2012-11-13 DIAGNOSIS — G8929 Other chronic pain: Secondary | ICD-10-CM

## 2012-11-13 DIAGNOSIS — Z1231 Encounter for screening mammogram for malignant neoplasm of breast: Secondary | ICD-10-CM

## 2012-11-13 LAB — COMPLETE METABOLIC PANEL WITH GFR
AST: 21 U/L (ref 0–37)
Albumin: 4.6 g/dL (ref 3.5–5.2)
Alkaline Phosphatase: 87 U/L (ref 39–117)
Calcium: 9.8 mg/dL (ref 8.4–10.5)
Chloride: 102 mEq/L (ref 96–112)
Potassium: 4.2 mEq/L (ref 3.5–5.3)
Sodium: 140 mEq/L (ref 135–145)
Total Protein: 8.3 g/dL (ref 6.0–8.3)

## 2012-11-13 LAB — CBC
Hemoglobin: 14 g/dL (ref 12.0–15.0)
MCHC: 34.9 g/dL (ref 30.0–36.0)
RDW: 12.7 % (ref 11.5–15.5)
WBC: 6.7 10*3/uL (ref 4.0–10.5)

## 2012-11-13 MED ORDER — FLUTICASONE PROPIONATE 50 MCG/ACT NA SUSP: 2.0000 | Freq: Every day | NASAL | Status: DC

## 2012-11-13 NOTE — Patient Instructions (Addendum)
Tennis Elbow Your caregiver has diagnosed you with a condition often referred to as "tennis elbow." This results from small tears or soreness (inflammation) at the start (origin) of the extensor muscles of the forearm. Although the condition is often called tennis or golfer's elbow, it is caused by any repetitive action performed by your elbow. HOME CARE INSTRUCTIONS  If the condition has been short lived, rest may be the only treatment required. Using your opposite hand or arm to perform the task may help. Even changing your grip may help rest the extremity. These may even prevent the condition from recurring.  Longer standing problems, however, will often be relieved faster by:  Using anti-inflammatory agents.  Applying ice packs for 30 minutes at the end of the working day, at bed time, or when activities are finished.  Your caregiver may also have you wear a splint or sling. This will allow the inflamed tendon to heal. At times, steroid injections aided with a local anesthetic will be required along with splinting for 1 to 2 weeks. Two to three steroid injections will often solve the problem. In some long standing cases, the inflamed tendon does not respond to conservative (non-surgical) therapy. Then surgery may be required to repair it. MAKE SURE YOU:   Understand these instructions.  Will watch your condition.  Will get help right away if you are not doing well or get worse. Document Released: 04/04/2005 Document Revised: 06/27/2011 Document Reviewed: 11/21/2007 [facility] [place] [facility] Patient Information 2014 [place], [place].   Hypertension As your heart beats, it forces blood through your arteries. This force is your blood pressure. If the pressure is too high, it is called hypertension (HTN) or high blood pressure. HTN is dangerous because you may have it and not know it. High blood pressure may mean that your heart has to work harder to pump blood. Your arteries may be narrow or stiff. The  extra work puts you at risk for heart disease, stroke, and other problems.  Blood pressure consists of two numbers, a higher number over a lower, 110/72, for example. It is stated as "110 over 72." The ideal is below 120 for the top number (systolic) and under 80 for the bottom (diastolic). Write down your blood pressure today. You should pay close attention to your blood pressure if you have certain conditions such as:  Heart failure.  Prior heart attack.  Diabetes  Chronic kidney disease.  Prior stroke.  Multiple risk factors for heart disease. To see if you have HTN, your blood pressure should be measured while you are seated with your arm held at the level of the heart. It should be measured at least twice. A one-time elevated blood pressure reading (especially in the Emergency Department) does not mean that you need treatment. There may be conditions in which the blood pressure is different between your right and left arms. It is important to see your caregiver soon for a recheck. Most people have essential hypertension which means that there is not a specific cause. This type of high blood pressure may be lowered by changing lifestyle factors such as:  Stress.  Smoking.  Lack of exercise.  Excessive weight.  Drug/tobacco/alcohol use.  Eating less salt. Most people do not have symptoms from high blood pressure until it has caused damage to the body. Effective treatment can often prevent, delay or reduce that damage. TREATMENT  When a cause has been identified, treatment for high blood pressure is directed at the cause. There are a large  number of medications to treat HTN. These fall into several categories, and your caregiver will help you select the medicines that are best for you. Medications may have side effects. You should review side effects with your caregiver. If your blood pressure stays high after you have made lifestyle changes or started on medicines,   Your  medication(s) may need to be changed.  Other problems may need to be addressed.  Be certain you understand your prescriptions, and know how and when to take your medicine.  Be sure to follow up with your caregiver within the time frame advised (usually within two weeks) to have your blood pressure rechecked and to review your medications.  If you are taking more than one medicine to lower your blood pressure, make sure you know how and at what times they should be taken. Taking two medicines at the same time can result in blood pressure that is too low. SEEK IMMEDIATE MEDICAL CARE IF:  You develop a severe headache, blurred or changing vision, or confusion.  You have unusual weakness or numbness, or a faint feeling.  You have severe chest or abdominal pain, vomiting, or breathing problems. MAKE SURE YOU:   Understand these instructions.  Will watch your condition.  Will get help right away if you are not doing well or get worse. Document Released: 04/04/2005 Document Revised: 06/27/2011 Document Reviewed: 11/23/2007 [facility] Patient Information 2014 [place], [place].

## 2012-11-13 NOTE — Progress Notes (Signed)
Patient ID: Lindsey Suarez, female   DOB: [birth year], 56 y.o.   MRN: [id number]  CC: follow up   HPI: Pt says that she is feeling better.  Still having pain but much less.  Pt having sinus congestion. Pt says that she is still having nagging and persistent pain of right tenosynovitis of her elbow.  She is reporting that she is having pain in her joints and aches of her body.  Pt says that she was told back in the 1990s that she had rheumatoid arthritis.  She says that she has never been on treatment for it in the past.  Pt says that she is otherwise doing well.    Allergies  Allergen Reactions  . Sulfa Antibiotics    Past Medical History  Diagnosis Date  . Arthritis    Current Outpatient Prescriptions on File Prior to Visit  Medication Sig Dispense Refill  . hydrochlorothiazide (HYDRODIURIL) 12.5 MG tablet Take 1 tablet (12.5 mg total) by mouth daily.  30 tablet  3  . HYDROcodone-acetaminophen (NORCO/VICODIN) 5-325 MG per tablet Take 1 tablet by mouth every 8 (eight) hours as needed for pain.  20 tablet  0  . naproxen (NAPROSYN) 500 MG tablet Take 1 po every 12 hours with food prn pain and inflammation  30 tablet  0  . azithromycin (ZITHROMAX) 250 MG tablet Z-Pak  6 tablet  0  . furosemide (LASIX) 40 MG tablet Take 1 tablet (40 mg total) by mouth daily.  30 tablet  3  . guaiFENesin (MUCINEX) 600 MG 12 hr tablet Take 2 tablets (1,200 mg total) by mouth 2 (two) times daily as needed for congestion.  60 tablet  0  . meloxicam (MOBIC) 15 MG tablet Take 1 tablet (15 mg total) by mouth daily.  30 tablet  0   No current facility-administered medications on file prior to visit.   No family history on file. History   Social History  . Marital Status: Legally Separated    Spouse Name: N/A    Number of Children: N/A  . Years of Education: N/A   Occupational History  . Not on file.   Social History Main Topics  . Smoking status: Never Smoker   . Smokeless tobacco: Not on file  . Alcohol  Use: Not on file  . Drug Use: Not on file  . Sexually Active: Not on file   Other Topics Concern  . Not on file   Social History Narrative  . No narrative on file    Review of Systems  Constitutional: Negative for fever, chills, diaphoresis, activity change, appetite change and fatigue.  HENT: Negative for ear pain, nosebleeds, congestion, facial swelling, rhinorrhea, neck pain, neck stiffness and ear discharge.   Eyes: Negative for pain, discharge, redness, itching and visual disturbance.  Respiratory: Negative for cough, choking, chest tightness, shortness of breath, wheezing and stridor.   Cardiovascular: Negative for chest pain, palpitations and leg swelling.  Gastrointestinal: Negative for abdominal distention.  Genitourinary: Negative for dysuria, urgency, frequency, hematuria, flank pain, decreased urine volume, difficulty urinating and dyspareunia.  Musculoskeletal: painful right elbow and diffuse myalgia and arthralgias.  Neurological: Negative for dizziness, tremors, seizures, syncope, facial asymmetry, speech difficulty, weakness, light-headedness, numbness and headaches.  Hematological: Negative for adenopathy. Does not bruise/bleed easily.  Psychiatric/Behavioral: Negative for hallucinations, behavioral problems, confusion, dysphoric mood, decreased concentration and agitation.    Objective:   Filed Vitals:   11/13/12 0926  BP: 157/78  Pulse: 67  Temp: 98.7 F (  37.1 C)    Physical Exam  Constitutional: Appears well-developed and well-nourished. No distress.  HENT: Normocephalic. External right and left ear normal. Oropharynx is clear and moist.  Eyes: Conjunctivae and EOM are normal. PERRLA, no scleral icterus.  Neck: Normal ROM. Neck supple. No JVD. No tracheal deviation. No thyromegaly.  CVS: RRR, S1/S2 +, no murmurs, no gallops, no carotid bruit.  Pulmonary: Effort and breath sounds normal, no stridor, rhonchi, wheezes, rales.  Abdominal: Soft. BS +,  no  distension, tenderness, rebound or guarding.  Musculoskeletal: tender right lateral epicondyle.  Lymphadenopathy: No lymphadenopathy noted, cervical, inguinal. Neuro: Alert. Normal reflexes, muscle tone coordination. No cranial nerve deficit. Skin: Skin is warm and dry. No rash noted. Not diaphoretic. No erythema. No pallor.  Psychiatric: Normal mood and affect. Behavior, judgment, thought content normal.   Lab Results  Component Value Date   WBC 6.4 09/11/2012   HGB 13.3 09/11/2012   HCT 37.8 09/11/2012   MCV 79.2 09/11/2012   PLT 337 09/11/2012   Lab Results  Component Value Date   CREATININE 0.95 09/11/2012   BUN 7 09/11/2012   NA 138 09/11/2012   K 4.8 09/11/2012   CL 106 09/11/2012   CO2 24 09/11/2012    No results found for this basename: HGBA1C   Lipid Panel     Component Value Date/Time   CHOL 144 07/16/2007 2021   TRIG 80 07/16/2007 2021   HDL 56 07/16/2007 2021   CHOLHDL 2.6 Ratio 07/16/2007 2021   VLDL 16 07/16/2007 2021   LDLCALC 72 07/16/2007 2021     Assessment and plan:   Patient Active Problem List   Diagnosis Date Noted  . Rib fracture 10/25/2012  . Chest pain 10/25/2012  . Abdominal pain, chronic, epigastric 10/25/2012  . Tenosynovitis of elbow 09/11/2012  . Peripheral edema 09/11/2012  . Chronic cough 09/11/2012       GERD   Screening mammogram ordered   Persistent right tenosynovitis lateral epicondylitis  - referral to sports medicine for possible injection in the area for relief  Flonase NS  - 2 sprays per nostril once daily   Continue HCT 12.5 mg po daily  Continue naproxen as needed for pain and discomfort  The patient was given clear instructions to go to ER or return to medical center if symptoms don't improve, worsen or new problems develop.  The patient verbalized understanding.  The patient was told to call to get any lab results if not heard anything in the next week.    Rodney Langton, MD, CDE, FAAFP [facility] [place], [place]

## 2012-11-14 ENCOUNTER — Telehealth: Payer: Self-pay | Admitting: *Deleted

## 2012-11-14 NOTE — Progress Notes (Signed)
Quick Note:  Please report to patieht that labs came back OK. Sed rate was only mildly elevated and all other tests for inflammation and arthritis came back normal.   Rodney Langton, MD, CDE, FAAFP [facility] [place], [place]   ______

## 2012-11-14 NOTE — Telephone Encounter (Signed)
11/14/12  Spoke with patient made aware of lab results came back okay  Sed rate was mildly elevated  And all other tests for inflammation and arthritis came back normal. Sherlene Shams BSN [facility]

## 2012-11-27 ENCOUNTER — Ambulatory Visit
Admission: RE | Admit: 2012-11-27 | Discharge: 2012-11-27 | Disposition: A | Discharge status: Home / Self Care | Payer: No Typology Code available for payment source | Source: Ambulatory Visit | Attending: Family Medicine | Admitting: Family Medicine

## 2012-11-27 DIAGNOSIS — Z1231 Encounter for screening mammogram for malignant neoplasm of breast: Secondary | ICD-10-CM

## 2012-11-28 ENCOUNTER — Ambulatory Visit (INDEPENDENT_AMBULATORY_CARE_PROVIDER_SITE_OTHER): Payer: No Typology Code available for payment source | Admitting: Sports Medicine

## 2012-11-28 VITALS — BP 132/70 | Ht 62.0 in | Wt 183.0 lb

## 2012-11-28 DIAGNOSIS — R059 Cough, unspecified: Secondary | ICD-10-CM

## 2012-11-28 DIAGNOSIS — M6580 Other synovitis and tenosynovitis, unspecified site: Secondary | ICD-10-CM

## 2012-11-28 DIAGNOSIS — M659 Synovitis and tenosynovitis, unspecified: Secondary | ICD-10-CM

## 2012-11-28 DIAGNOSIS — R05 Cough: Secondary | ICD-10-CM

## 2012-11-28 NOTE — Patient Instructions (Addendum)
You have been scheduled for an appointment with Dr. Bonney Aid at [facility] on 12/27/12 @ 10:15 please arrive 15 min early for paper work. Their office is located at [address] [place], and phone number is [phone].

## 2012-11-28 NOTE — Progress Notes (Signed)
  Subjective:    Patient ID: Lindsey Suarez, female    DOB: January 25, 1956, 57 y.o.   MRN: [id number]  HPI chief complaint: Right elbow pain  57 year old right-hand-dominant female comes in today complaining of 1 year of right elbow pain. No injury that she can recall but gradual onset of pain that she localizes to the lateral elbow. At times were radiate into the forearm. It is worse with wrist related activity such as grip or with twisting. She denies any associated numbness or tingling. No real treatment to date. No prior elbow surgeries. No pain more proximally in the shoulder or in the neck.  Past medical history and current medications are reviewed. Allergies are reviewed\ Socially she does not smoke, and is currently unemployed     Review of Systems     Objective:   Physical Exam Well-developed, well-nourished. She has a rather dry hacking cough which she states has been present now since January. No acute distress.  Right elbow: Patient has full extension but lacks about 10 of flexion when compared to the uninvolved left elbow. There is tenderness to palpation over the lateral epicondyles with reproducible pain with resisted ECRB testing. No joint effusion. No tenderness over the medial epicondyle. No tenderness to palpation over the radial tunnel. Good grip strength. Good radial and ulnar pulses.       Assessment & Plan:  1. 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 Chronic cough  Patient's right lateral epicondyle was injected after risks and benefits were explained including the risk of hypopigmentation. Patient tolerated this without difficulty. I'm going to send her for physical therapy and I'll see her in followup in 4 weeks. For her chronic cough I will try to get her an appointment with pulmonary. She has normal x-rays and CT scans of her chest but her cough is definitely affecting her quality of life. Again, patient will followup with  me in 4 weeks and if symptoms persist in the right elbow I will consider diagnostic imaging.  Consent obtained and verified. Time-out conducted. Noted no overlying erythema, induration, or other signs of local infection. Skin prepped in a sterile fashion. Topical analgesic spray: Ethyl chloride. Joint: right lat epicondyle (elbow) Needle: 25g 1 1/2 inch Completed without difficulty. Meds: 1cc (40mg ) depomedrol, 3cc 1% xylocaine  Advised to call if fevers/chills, erythema, induration, drainage, or persistent bleeding.

## 2012-12-03 ENCOUNTER — Other Ambulatory Visit: Payer: Self-pay | Admitting: Family Medicine

## 2012-12-03 DIAGNOSIS — R928 Other abnormal and inconclusive findings on diagnostic imaging of breast: Secondary | ICD-10-CM

## 2012-12-05 ENCOUNTER — Ambulatory Visit: Payer: No Typology Code available for payment source | Attending: Sports Medicine | Admitting: Physical Therapy

## 2012-12-20 ENCOUNTER — Ambulatory Visit
Admission: RE | Admit: 2012-12-20 | Discharge: 2012-12-20 | Disposition: A | Discharge status: Home / Self Care | Payer: No Typology Code available for payment source | Source: Ambulatory Visit | Attending: Family Medicine | Admitting: Family Medicine

## 2012-12-20 DIAGNOSIS — R928 Other abnormal and inconclusive findings on diagnostic imaging of breast: Secondary | ICD-10-CM

## 2012-12-27 ENCOUNTER — Encounter: Payer: Self-pay | Admitting: Internal Medicine

## 2012-12-27 ENCOUNTER — Ambulatory Visit (INDEPENDENT_AMBULATORY_CARE_PROVIDER_SITE_OTHER): Payer: No Typology Code available for payment source | Admitting: Internal Medicine

## 2012-12-27 VITALS — BP 130/88 | HR 65 | Ht 62.0 in | Wt 182.6 lb

## 2012-12-27 DIAGNOSIS — R05 Cough: Secondary | ICD-10-CM

## 2012-12-27 DIAGNOSIS — R053 Chronic cough: Secondary | ICD-10-CM

## 2012-12-27 DIAGNOSIS — R059 Cough, unspecified: Secondary | ICD-10-CM

## 2012-12-27 MED ORDER — GABAPENTIN 300 MG PO CAPS: ORAL_CAPSULE | ORAL | Status: DC

## 2012-12-27 NOTE — Progress Notes (Signed)
Subjective:    Patient ID: Lindsey Suarez, female    DOB: 1955/12/03, 57 y.o.   MRN: [id number] PCP Jeanann Lewandowsky, MD   HPI    IOV 12/27/2012   -y56ear-old female. Nonsmoker. Evaluation for chronic cough. Reports development of upper respiratory infection in December 2013 and after that since January 2014 has a chronic cough. Rates this as severe but has improved since July 2014. I cannot figure out if cough is dry or wet but it appears that when cough is severe she gags and crutches and brings him food contents out or stomach contents out. She might mistake this of phlegm. In July cough was so severe that she fractured her rib; CT scan report below. Nevertheless since July 2014 cough is around 60% better. This associated gag, tickle due to cough. Denies associated edema, orthopnea,  shortness of breath, hemoptysis. RSI cough score is 37 and c/w LPR cough. Cough differentiator shows GERD and neurogenic cough  Cpough RElevant hx   BP drugs - not on ace inhibitor  Sinus/Allergies  G/GERD - reportedly dsc as GERD. Tood nexium for cough. NO effect on cough   Pulmonary eval  Broke rib with cough. NEver tried mdi   NO [place] pulm issues  EXposures:  reports that she has never smoked. She does not have any smokeless tobacco history on file.   CT chest 10/25/12   IMPRESSION:  1. Acute, mildly displaced right lateral sixth rib fracture. No  pneumothorax.  2. No pulmonary embolism or other acute intrathoracic abnormality.  Original Report Authenticated By: Tiburcio Pea    12/27/2012  12/27/2012   Hoarseness of problem with voice 3  Clearing  Of Throat 5  Excess throat mucus or feeling of post nasal drip 4  Difficulty swallowing food, liquid or tablets 4  Cough after eating or lying down 5  Breathing difficulties or choking episodes 5  Troublesome or annoying cough 1  Sensation of something sticking in throat or lump in throat 5  Heartburn, chest pain, indigestion, or  stomach acid coming up 5  TOTAL 37     Kouffman Reflux v Neurogenic Cough Differentiator Reflux  Do you awaken from a sound sleep coughing violently?                            With trouble breathing? Yes  Do you have choking episodes when you cannot  Get enough air, gasping for air ?              NO  Do you usually cough when you lie down into  The bed, or when you just lie down to rest ?                          Yes  Do you usually cough after meals or eating?         Yes  Do you cough when (or after) you bend over?    Yes  GERD SCORE  4  Kouffman Reflux v Neurogenic Cough Differentiator nNeurogenic  Do you more-or-less cough all day long? Y, sometime  Does change of temperature make you cough? y  Does laughing or chuckling cause you to cough? y  Do fumes (perfume, automobile fumes, burned  Toast, etc.,) cause you to cough ?      y  Does speaking, singing, or talking on the phone cause you to cough   ?  y  Neurogenic/Airway score 5      Past Medical History  Diagnosis Date  . Arthritis   . Hypertension      Family History  Problem Relation Age of Onset  . Breast cancer Mother   . Breast cancer Maternal Aunt      History   Social History  . Marital Status: Legally Separated    Spouse Name: N/A    Number of Children: N/A  . Years of Education: N/A   Occupational History  . Not on file.   Social History Main Topics  . Smoking status: Never Smoker   . Smokeless tobacco: Not on file  . Alcohol Use: Not on file  . Drug Use: Not on file  . Sexual Activity: Not on file   Other Topics Concern  . Not on file   Social History Narrative  . No narrative on file     Allergies  Allergen Reactions  . Sulfa Antibiotics      Outpatient Prescriptions Prior to Visit  Medication Sig Dispense Refill  . fluticasone (FLONASE) 50 MCG/ACT nasal spray Place 2 sprays into the nose daily.  16 g  6  . hydrochlorothiazide (HYDRODIURIL) 12.5 MG tablet Take 1  tablet (12.5 mg total) by mouth daily.  30 tablet  3  . meloxicam (MOBIC) 15 MG tablet Take 1 tablet (15 mg total) by mouth daily.  30 tablet  0  . naproxen (NAPROSYN) 500 MG tablet Take 1 po every 12 hours with food prn pain and inflammation  30 tablet  0  . guaiFENesin (MUCINEX) 600 MG 12 hr tablet Take 2 tablets (1,200 mg total) by mouth 2 (two) times daily as needed for congestion.  60 tablet  0  . azithromycin (ZITHROMAX) 250 MG tablet Z-Pak  6 tablet  0  . furosemide (LASIX) 40 MG tablet Take 1 tablet (40 mg total) by mouth daily.  30 tablet  3  . HYDROcodone-acetaminophen (NORCO/VICODIN) 5-325 MG per tablet Take 1 tablet by mouth every 8 (eight) hours as needed for pain.  20 tablet  0   No facility-administered medications prior to visit.     Review of Systems  Constitutional: Negative for fever and unexpected weight change.  HENT: Positive for congestion. Negative for ear pain, nosebleeds, sore throat, rhinorrhea, sneezing, trouble swallowing, dental problem, postnasal drip and sinus pressure.   Eyes: Negative for redness and itching.  Respiratory: Positive for cough and shortness of breath. Negative for chest tightness and wheezing.   Cardiovascular: Negative for palpitations and leg swelling.  Gastrointestinal: Negative for nausea and vomiting.  Genitourinary: Negative for dysuria.  Musculoskeletal: Negative for joint swelling.  Skin: Negative for rash.  Neurological: Positive for headaches.  Hematological: Does not bruise/bleed easily.  Psychiatric/Behavioral: Negative for dysphoric mood. The patient is not nervous/anxious.        Objective:   Physical Exam  Vitals reviewed. Constitutional: She is oriented to person, place, and time. She appears well-developed and well-nourished. No distress.  HENT:  Head: Normocephalic and atraumatic.  Right Ear: External ear normal.  Left Ear: External ear normal.  Mouth/Throat: Oropharynx is clear and moist. No oropharyngeal exudate.   Barking cough + Cleras throat occ + Sinsu drainage +  Eyes: Conjunctivae and EOM are normal. Pupils are equal, round, and reactive to light. Right eye exhibits no discharge. Left eye exhibits no discharge. No scleral icterus.  Neck: Normal range of motion. Neck supple. No JVD present. No tracheal deviation present. No thyromegaly present.  Cardiovascular: Normal rate, regular rhythm, normal heart sounds and intact distal pulses.  Exam reveals no gallop and no friction rub.   No murmur heard. Pulmonary/Chest: Effort normal and breath sounds normal. No respiratory distress. She has no wheezes. She has no rales. She exhibits no tenderness.  Abdominal: Soft. Bowel sounds are normal. She exhibits no distension and no mass. There is no tenderness. There is no rebound and no guarding.  Musculoskeletal: Normal range of motion. She exhibits no edema and no tenderness.  Lymphadenopathy:    She has no cervical adenopathy.  Neurological: She is alert and oriented to person, place, and time. She has normal reflexes. No cranial nerve deficit. She exhibits normal muscle tone. Coordination normal.  Skin: Skin is warm and dry. No rash noted. She is not diaphoretic. No erythema. No pallor.  Psychiatric: She has a normal mood and affect. Her behavior is normal. Judgment and thought content normal.          Assessment & Plan:

## 2012-12-27 NOTE — Patient Instructions (Addendum)
Cough is from sinus drainage, acid reflux, possible asthma, All of this is working together to cause cyclical cough/LPR cough  #Sinus drainage  - start/continue netti pot daily - continue nasal steroid 2 squirts each nostril daily  #Possible Acid Reflux  - take otc zegerid 20 1 capsule daily on empty stomach   - take diet sheet from us - avoid colas, spices, cheeses, spirits, red meats, beer, chocolates, fried foods etc.,   - sleep with head end of bed elevated  - eat small frequent meals  - do not go to bed for 3 hours after last meal  #Asthma   - do methacholine challenge test between now and followup  #Cyclical Cough  - will refer you to speech therapy with Mr Carl Schinke  - please choose 3 days and observe complete voice rest - no talking or whispering  - at all times there  there is urge to cough, drink water or swallow or sip on throat lozenge  - start neurontin 300mg po daily  X 3 days, then 300mg po twice daily x 3 days, then 300mg po three times daily to continue. If this makes you sleepy, cut down on dose but you should continue neurontin at all times  #Followup - 4 weeks with NP Tammy; need methacholine results at time of followup - any problems call or come sooner  

## 2012-12-28 ENCOUNTER — Ambulatory Visit: Payer: No Typology Code available for payment source | Attending: Internal Medicine

## 2012-12-28 DIAGNOSIS — R498 Other voice and resonance disorders: Secondary | ICD-10-CM | POA: Insufficient documentation

## 2012-12-28 DIAGNOSIS — IMO0001 Reserved for inherently not codable concepts without codable children: Secondary | ICD-10-CM | POA: Insufficient documentation

## 2012-12-29 NOTE — Assessment & Plan Note (Signed)
Cough is from sinus drainage, acid reflux, possible asthma, All of this is working together to cause cyclical cough/LPR cough  #Sinus drainage  - start/continue netti pot daily - continue nasal steroid 2 squirts each nostril daily  #Possible Acid Reflux  - take otc zegerid 20 1 capsule daily on empty stomach   - take diet sheet from [place] - avoid colas, spices, cheeses, spirits, red meats, beer, chocolates, fried foods etc.,   - sleep with head end of bed elevated  - eat small frequent meals  - do not go to bed for 3 hours after last meal  #Asthma   - do methacholine challenge test between now and followup  #Cyclical Cough  - will refer you to speech therapy with Mr Verdie Mosher  - please choose 3 days and observe complete voice rest - no talking or whispering  - at all times there  there is urge to cough, drink water or swallow or sip on throat lozenge  - start neurontin 300mg  po daily  X 3 days, then 300mg  po twice daily x 3 days, then 300mg  po three times daily to continue. If this makes you sleepy, cut down on dose but you should continue neurontin at all times  #Followup - 4 weeks with NP Tammy; need methacholine results at time of followup - any problems call or come sooner

## 2012-12-31 ENCOUNTER — Ambulatory Visit: Payer: No Typology Code available for payment source | Admitting: Sports Medicine

## 2013-01-02 ENCOUNTER — Ambulatory Visit (INDEPENDENT_AMBULATORY_CARE_PROVIDER_SITE_OTHER): Payer: No Typology Code available for payment source | Admitting: Sports Medicine

## 2013-01-02 DIAGNOSIS — M659 Synovitis and tenosynovitis, unspecified: Secondary | ICD-10-CM

## 2013-01-02 DIAGNOSIS — M6580 Other synovitis and tenosynovitis, unspecified site: Secondary | ICD-10-CM

## 2013-01-02 NOTE — Progress Notes (Signed)
  Subjective:    Patient ID: Lindsey Suarez, female    DOB: 15-Jan-1956, 57 y.o.   MRN: [id number]  HPI Patient comes in today for followup on right elbow lateral epicondylitis. I performed a cortisone injection at her last office visit and this completely eliminated her pain. In fact I received a call from physical therapy stating that she had no pain at the time of her initial visit. She was shown some home exercises and discharged. She's done well since. She has also been seen by pulmonary for a chronic cough which is felt to be due to a combination of acid reflux, asthma, and chronic sinusitis.    Review of Systems     Objective:   Physical Exam Well-developed, well-nourished. No acute distress  Elbow: Full range of motion. No effusion. No soft tissue swelling. Slight tenderness to palpation of the lateral epicondyle and mild pain with ECRB testing but not marked. Neurovascularly intact distally.       Assessment & Plan:  Improved right elbow pain secondary to lateral epicondylitis  I reiterated the importance of her home exercises. Given her improvement I do not feel further workup or treatment is warranted at this time. Of note, she also mentions in passing some chronic low back pain. I gave her some home exercises to do and we discussed the possibility of physical therapy if symptoms warrant. Followup when necessary.

## 2013-01-03 ENCOUNTER — Ambulatory Visit: Payer: No Typology Code available for payment source | Admitting: Speech Pathology

## 2013-01-07 ENCOUNTER — Ambulatory Visit: Payer: No Typology Code available for payment source

## 2013-01-08 ENCOUNTER — Ambulatory Visit (HOSPITAL_COMMUNITY)
Admission: RE | Admit: 2013-01-08 | Discharge: 2013-01-08 | Disposition: A | Discharge status: Home / Self Care | Payer: No Typology Code available for payment source | Source: Ambulatory Visit | Attending: Internal Medicine | Admitting: Internal Medicine

## 2013-01-08 DIAGNOSIS — R05 Cough: Secondary | ICD-10-CM | POA: Insufficient documentation

## 2013-01-08 DIAGNOSIS — R053 Chronic cough: Secondary | ICD-10-CM

## 2013-01-08 DIAGNOSIS — R059 Cough, unspecified: Secondary | ICD-10-CM | POA: Insufficient documentation

## 2013-01-08 LAB — PULMONARY FUNCTION TEST

## 2013-01-08 MED ORDER — SODIUM CHLORIDE 0.9 % IN NEBU
3.0000 mL | INHALATION_SOLUTION | Freq: Once | RESPIRATORY_TRACT | Status: AC
  Administered 2013-01-08: 3 mL via RESPIRATORY_TRACT

## 2013-01-08 MED ORDER — ALBUTEROL SULFATE (5 MG/ML) 0.5% IN NEBU
2.5000 mg | INHALATION_SOLUTION | Freq: Once | RESPIRATORY_TRACT | Status: AC
  Administered 2013-01-08: 2.5 mg via RESPIRATORY_TRACT

## 2013-01-08 MED ORDER — METHACHOLINE 1 MG/ML NEB SOLN
2.0000 mL | Freq: Once | RESPIRATORY_TRACT | Status: AC
  Administered 2013-01-08: 2 mg via RESPIRATORY_TRACT

## 2013-01-08 MED ORDER — METHACHOLINE 0.25 MG/ML NEB SOLN
2.0000 mL | Freq: Once | RESPIRATORY_TRACT | Status: AC
  Administered 2013-01-08: 0.5 mg via RESPIRATORY_TRACT

## 2013-01-08 MED ORDER — METHACHOLINE 16 MG/ML NEB SOLN
2.0000 mL | Freq: Once | RESPIRATORY_TRACT | Status: AC
  Administered 2013-01-08: 32 mg via RESPIRATORY_TRACT

## 2013-01-08 MED ORDER — METHACHOLINE 0.0625 MG/ML NEB SOLN
2.0000 mL | Freq: Once | RESPIRATORY_TRACT | Status: AC
  Administered 2013-01-08: 0.125 mg via RESPIRATORY_TRACT

## 2013-01-08 MED ORDER — METHACHOLINE 4 MG/ML NEB SOLN
2.0000 mL | Freq: Once | RESPIRATORY_TRACT | Status: AC
  Administered 2013-01-08: 8 mg via RESPIRATORY_TRACT

## 2013-01-10 ENCOUNTER — Ambulatory Visit: Payer: No Typology Code available for payment source | Admitting: Speech Pathology

## 2013-01-10 ENCOUNTER — Telehealth: Payer: Self-pay | Admitting: Internal Medicine

## 2013-01-10 NOTE — Telephone Encounter (Signed)
Methacholine 01/08/13 PC20 is between 4 and 16 and therefore borderline positive.   NO FV loop issues  She is seeing TP 01/24/13 as fu of cough. COuld try empiric ICS  Thanks  Dr. Kalman Shan, M.D., [facility]C.P Pulmonary and Critical Care Medicine Staff Physician [facility] Pager: [phone], If no answer or between  15:00h - 7:00h: call [phone]  01/10/2013 4:55 PM

## 2013-01-14 ENCOUNTER — Encounter: Payer: No Typology Code available for payment source | Admitting: Speech Pathology

## 2013-01-17 ENCOUNTER — Ambulatory Visit: Payer: No Typology Code available for payment source | Attending: Internal Medicine

## 2013-01-17 DIAGNOSIS — IMO0001 Reserved for inherently not codable concepts without codable children: Secondary | ICD-10-CM | POA: Insufficient documentation

## 2013-01-17 DIAGNOSIS — R498 Other voice and resonance disorders: Secondary | ICD-10-CM | POA: Insufficient documentation

## 2013-01-21 ENCOUNTER — Ambulatory Visit: Payer: No Typology Code available for payment source

## 2013-01-23 ENCOUNTER — Ambulatory Visit: Payer: No Typology Code available for payment source

## 2013-01-24 ENCOUNTER — Encounter: Payer: Self-pay | Admitting: Adult Health

## 2013-01-24 ENCOUNTER — Ambulatory Visit (INDEPENDENT_AMBULATORY_CARE_PROVIDER_SITE_OTHER): Payer: No Typology Code available for payment source | Admitting: Adult Health

## 2013-01-24 VITALS — BP 140/92 | HR 78 | Temp 97.9°F | Ht 62.0 in | Wt 182.0 lb

## 2013-01-24 DIAGNOSIS — Z23 Encounter for immunization: Secondary | ICD-10-CM

## 2013-01-24 DIAGNOSIS — R053 Chronic cough: Secondary | ICD-10-CM

## 2013-01-24 DIAGNOSIS — R05 Cough: Secondary | ICD-10-CM

## 2013-01-24 DIAGNOSIS — R059 Cough, unspecified: Secondary | ICD-10-CM

## 2013-01-24 NOTE — Patient Instructions (Addendum)
Continue on Zegerid daily  GERD diet .  Great job on [employer] therapy.  Continue on Neurontin Three times a day  For 1 week then decrease Twice daily  Until seen back in office  follow up Dr. Marchelle Gearing in 4 weeks and As needed   Please contact office for sooner follow up if symptoms do not improve or worsen or seek emergency care

## 2013-01-30 NOTE — Assessment & Plan Note (Addendum)
Methacholine challenge test borderline positive  Cough is improved on present regimen  Will hold off on ICS therapy for now  Lower neurontin dose d/t SE of sleepiness   Plan  Continue on Zegerid daily  GERD diet .  Great job on [employer] therapy.  Continue on Neurontin Three times a day  For 1 week then decrease Twice daily  Until seen back in office  follow up Dr. Marchelle Gearing in 4 weeks and As needed   Please contact office for sooner follow up if symptoms do not improve or worsen or seek emergency care

## 2013-01-30 NOTE — Progress Notes (Signed)
Subjective:    Patient ID: Lindsey Suarez, female    DOB: May 06, 1955, 57 y.o.   MRN: [id number] PCP Jeanann Lewandowsky, MD   HPI    IOV 12/27/2012   -y56ear-old female. Nonsmoker. Evaluation for chronic cough. Reports development of upper respiratory infection in December 2013 and after that since January 2014 has a chronic cough. Rates this as severe but has improved since July 2014. I cannot figure out if cough is dry or wet but it appears that when cough is severe she gags and crutches and brings him food contents out or stomach contents out. She might mistake this of phlegm. In July cough was so severe that she fractured her rib; CT scan report below. Nevertheless since July 2014 cough is around 60% better. This associated gag, tickle due to cough. Denies associated edema, orthopnea,  shortness of breath, hemoptysis. RSI cough score is 37 and c/w LPR cough. Cough differentiator shows GERD and neurogenic cough  Cpough RElevant hx   BP drugs - not on ace inhibitor  Sinus/Allergies  G/GERD - reportedly dsc as GERD. Tood nexium for cough. NO effect on cough   Pulmonary eval  Broke rib with cough. NEver tried mdi   NO [place] pulm issues  EXposures:  reports that she has never smoked. She does not have any smokeless tobacco history on file.   CT chest 10/25/12   IMPRESSION:  1. Acute, mildly displaced right lateral sixth rib fracture. No  pneumothorax.  2. No pulmonary embolism or other acute intrathoracic abnormality.  Original Report Authenticated By: Tiburcio Pea    12/27/2012  12/27/2012   Hoarseness of problem with voice 3  Clearing  Of Throat 5  Excess throat mucus or feeling of post nasal drip 4  Difficulty swallowing food, liquid or tablets 4  Cough after eating or lying down 5  Breathing difficulties or choking episodes 5  Troublesome or annoying cough 1  Sensation of something sticking in throat or lump in throat 5  Heartburn, chest pain, indigestion, or  stomach acid coming up 5  TOTAL 37     Kouffman Reflux v Neurogenic Cough Differentiator Reflux  Do you awaken from a sound sleep coughing violently?                            With trouble breathing? Yes  Do you have choking episodes when you cannot  Get enough air, gasping for air ?              NO  Do you usually cough when you lie down into  The bed, or when you just lie down to rest ?                          Yes  Do you usually cough after meals or eating?         Yes  Do you cough when (or after) you bend over?    Yes  GERD SCORE  4  Kouffman Reflux v Neurogenic Cough Differentiator nNeurogenic  Do you more-or-less cough all day long? Y, sometime  Does change of temperature make you cough? y  Does laughing or chuckling cause you to cough? y  Do fumes (perfume, automobile fumes, burned  Toast, etc.,) cause you to cough ?      y  Does speaking, singing, or talking on the phone cause you to cough   ?  y  Neurogenic/Airway score 5   01/24/13 Follow up  4 week follow up cough - reports cough is 95-98% improved since last ov with visits to Dr Jeralene Huff.  no new complaints Finished speech therapy .  Has Methacholine 01/08/13 PC20 is between 4 and 16 and therefore borderline positive.  NO FV loop issues She feels so much better, cough is almost totally gone  Remains on neurontin but makes her sleepy in datyime.     Review of Systems  Constitutional: Negative for fever and unexpected weight change.  HENT:. Negative for ear pain, nosebleeds, sore throat, rhinorrhea, sneezing, trouble swallowing, dental problem, postnasal drip and sinus pressure.   Eyes: Negative for redness and itching.  Respiratory: Positive for cough and  Negative for chest tightness and wheezing.   Cardiovascular: Negative for palpitations and leg swelling.  Gastrointestinal: Negative for nausea and vomiting.  Genitourinary: Negative for dysuria.  Musculoskeletal: Negative for joint swelling.  Skin:  Negative for rash.  Neurological: Positive for headaches.  Hematological: Does not bruise/bleed easily.  Psychiatric/Behavioral: Negative for dysphoric mood. The patient is not nervous/anxious.        Objective:   Physical Exam  GEN: A/Ox3; pleasant , NAD, well nourished   HEENT:  [place]/AT,  EACs-clear, TMs-wnl, NOSE-clear, THROAT-clear, no lesions, no postnasal drip or exudate noted.   NECK:  Supple w/ fair ROM; no JVD; normal carotid impulses w/o bruits; no thyromegaly or nodules palpated; no lymphadenopathy.  RESP  Clear  P & A; w/o, wheezes/ rales/ or rhonchi.no accessory muscle use, no dullness to percussion  CARD:  RRR, no m/r/g  , no peripheral edema, pulses intact, no cyanosis or clubbing.  GI:   Soft & nt; nml bowel sounds; no organomegaly or masses detected.  Musco: Warm bil, no deformities or joint swelling noted.   Neuro: alert, no focal deficits noted.    Skin: Warm, no lesions or rashes        Assessment & Plan:

## 2013-02-28 ENCOUNTER — Ambulatory Visit: Payer: No Typology Code available for payment source | Admitting: Internal Medicine

## 2013-03-05 ENCOUNTER — Encounter: Payer: Self-pay | Admitting: Internal Medicine

## 2013-03-05 ENCOUNTER — Telehealth: Payer: Self-pay | Admitting: Internal Medicine

## 2013-03-05 NOTE — Telephone Encounter (Signed)
Patient would like a dentist referral.

## 2013-03-12 ENCOUNTER — Ambulatory Visit: Payer: No Typology Code available for payment source

## 2013-03-22 ENCOUNTER — Ambulatory Visit: Payer: No Typology Code available for payment source | Attending: Internal Medicine

## 2013-03-27 ENCOUNTER — Ambulatory Visit (INDEPENDENT_AMBULATORY_CARE_PROVIDER_SITE_OTHER): Payer: No Typology Code available for payment source | Admitting: Internal Medicine

## 2013-03-27 ENCOUNTER — Encounter: Payer: Self-pay | Admitting: Internal Medicine

## 2013-03-27 VITALS — BP 120/82 | HR 86 | Ht 62.0 in | Wt 177.8 lb

## 2013-03-27 DIAGNOSIS — R053 Chronic cough: Secondary | ICD-10-CM

## 2013-03-27 DIAGNOSIS — R059 Cough, unspecified: Secondary | ICD-10-CM

## 2013-03-27 DIAGNOSIS — R05 Cough: Secondary | ICD-10-CM

## 2013-03-27 MED ORDER — GABAPENTIN 300 MG PO CAPS: ORAL_CAPSULE | ORAL | Status: DC

## 2013-03-27 NOTE — Progress Notes (Signed)
Subjective:    Patient ID: Lindsey Suarez, female    DOB: 08-28-1955, 57 y.o.   MRN: [id number]  HPI  PCP Jeanann Lewandowsky, MD    IOV 12/27/2012   -57ear-old female. Nonsmoker. Evaluation for chronic cough. Reports development of upper respiratory infection in December 2013 and after that since January 2014 has a chronic cough. Rates this as severe but has improved since July 2014. I cannot figure out if cough is dry or wet but it appears that when cough is severe she gags and crutches and brings him food contents out or stomach contents out. She might mistake this of phlegm. In July cough was so severe that she fractured her rib; CT scan report below. Nevertheless since July 2014 cough is around 60% better. This associated gag, tickle due to cough. Denies associated edema, orthopnea,  shortness of breath, hemoptysis. RSI cough score is 37 and c/w LPR cough. Cough differentiator shows GERD and neurogenic cough  Cough RElevant hx   BP drugs - not on ace inhibitor  Sinus/Allergies  G/GERD - reportedly dsc as GERD. Tood nexium for cough. NO effect on cough   Pulmonary eval  Broke rib with cough. NEver tried mdi   NO [place] pulm issues  EXposures:  reports that she has never smoked. She does not have any smokeless tobacco history on file.   CT chest 10/25/12   IMPRESSION:  1. Acute, mildly displaced right lateral sixth rib fracture. No  pneumothorax.  2. No pulmonary embolism or other acute intrathoracic abnormality.  Original Report Authenticated By: Tiburcio Pea    01/24/13 Follow up  4 week follow up cough - reports cough is 95-98% improved since last ov with visits to Dr Jeralene Huff.  no new complaints Finished speech therapy .  Has Methacholine 01/08/13 PC20 is between 4 and 16 and therefore borderline positive.  NO [facility] loop issues She feels so much better, cough is almost totally gone  Remains on neurontin but makes her sleepy in datyime.   Continue on Zegerid daily   GERD diet .  Great job on [employer] therapy.  Continue on Neurontin Three times a day  For 1 week then decrease Twice daily  Until seen back in office  follow up Dr. Marchelle Gearing in 4 weeks and As needed   Please contact office for sooner follow up if symptoms do not improve or worsen or seek emergency care      OV 03/27/2013  Followup chronic cough. Cough improved with measures of sinus control, Neurontin, acid reflux control. To the point that when she saw my nurse practitioner 2 months ago cough is almost resolved. However around this time she ran out of money and did not fill her Neurontin prescriptions and of cough got worse. A month ago her RSI cough score was 36 and is improved now to 21 he did. This is all better than when she first him in September 2014 but still worse than her best 2 months ago. She says she is compliant with acid reflux prescription and sinus treatment but not to the gabapentin. She still attending speech therapy. I also noted that her methacholine challenge test was borderline positive but she's not on inhaled corticosteroids. Another thing to note is that she heavily perfumed whichs he says does not make her cough worse    Note, she is interested in Pneumovax shot. Explained to her that insurance might deny this because she does not meet appropriate criteria. There for she's agreed to  defer this   12/27/2012  12/27/2012  03/27/2013 1 mo ago 03/27/2013 Past week  Hoarseness of problem with voice 3 5 3   Clearing  Of Throat 5 5 3   Excess throat mucus or feeling of post nasal drip 4 5 3   Difficulty swallowing food, liquid or tablets 4 5 3   Cough after eating or lying down 5 5 3   Breathing difficulties or choking episodes 5 5 3   Troublesome or annoying cough 1 5 3   Sensation of something sticking in throat or lump in throat 5 1 0  Heartburn, chest pain, indigestion, or stomach acid coming up 5 0 0  TOTAL 37 36 21     Kouffman Reflux v Neurogenic Cough  Differentiator Reflux  Do you awaken from a sound sleep coughing violently?                            With trouble breathing? Yes  Do you have choking episodes when you cannot  Get enough air, gasping for air ?              NO  Do you usually cough when you lie down into  The bed, or when you just lie down to rest ?                          Yes  Do you usually cough after meals or eating?         Yes  Do you cough when (or after) you bend over?    Yes  GERD SCORE  4  Kouffman Reflux v Neurogenic Cough Differentiator nNeurogenic  Do you more-or-less cough all day long? Y, sometime  Does change of temperature make you cough? y  Does laughing or chuckling cause you to cough? y  Do fumes (perfume, automobile fumes, burned  Toast, etc.,) cause you to cough ?      y  Does speaking, singing, or talking on the phone cause you to cough   ?               y  Neurogenic/Airway score 5    Current outpatient prescriptions:fluticasone (FLONASE) 50 MCG/ACT nasal spray, Place 2 sprays into the nose daily., Disp: 16 g, Rfl: 6;  ibuprofen (ADVIL,MOTRIN) 400 MG tablet, Take 400 mg by mouth every 6 (six) hours as needed., Disp: , Rfl: ;  gabapentin (NEURONTIN) 300 MG capsule, Take 300 mg by mouth 3 (three) times daily., Disp: , Rfl:  guaiFENesin (MUCINEX) 600 MG 12 hr tablet, Take 2 tablets (1,200 mg total) by mouth 2 (two) times daily as needed for congestion., Disp: 60 tablet, Rfl: 0   Review of Systems  Constitutional: Negative for fever and unexpected weight change.  HENT: Negative for congestion, dental problem, ear pain, nosebleeds, postnasal drip, rhinorrhea, sinus pressure, sneezing, sore throat and trouble swallowing.   Eyes: Negative for redness and itching.  Respiratory: Positive for cough and shortness of breath. Negative for chest tightness and wheezing.   Cardiovascular: Negative for palpitations and leg swelling.  Gastrointestinal: Negative for nausea and vomiting.  Genitourinary: Negative  for dysuria.  Musculoskeletal: Negative for joint swelling.  Skin: Negative for rash.  Neurological: Positive for headaches.  Hematological: Does not bruise/bleed easily.  Psychiatric/Behavioral: Negative for dysphoric mood. The patient is not nervous/anxious.        Objective:   Physical Exam  Vitals reviewed. Constitutional: She is oriented to  person, place, and time. She appears well-developed and well-nourished. No distress.  HENT:  Head: Normocephalic and atraumatic.  Right Ear: External ear normal.  Left Ear: External ear normal.  Mouth/Throat: Oropharynx is clear and moist. No oropharyngeal exudate.  Eyes: Conjunctivae and EOM are normal. Pupils are equal, round, and reactive to light. Right eye exhibits no discharge. Left eye exhibits no discharge. No scleral icterus.  Neck: Normal range of motion. Neck supple. No JVD present. No tracheal deviation present. No thyromegaly present.  Cardiovascular: Normal rate, regular rhythm, normal heart sounds and intact distal pulses.  Exam reveals no gallop and no friction rub.   No murmur heard. Pulmonary/Chest: Effort normal and breath sounds normal. No respiratory distress. She has no wheezes. She has no rales. She exhibits no tenderness.  Abdominal: Soft. Bowel sounds are normal. She exhibits no distension and no mass. There is no tenderness. There is no rebound and no guarding.  Musculoskeletal: Normal range of motion. She exhibits no edema and no tenderness.  Lymphadenopathy:    She has no cervical adenopathy.  Neurological: She is alert and oriented to person, place, and time. She has normal reflexes. No cranial nerve deficit. She exhibits normal muscle tone. Coordination normal.  Skin: Skin is warm and dry. No rash noted. She is not diaphoretic. No erythema. No pallor.  Psychiatric: She has a normal mood and affect. Her behavior is normal. Judgment and thought content normal.          Assessment & Plan:

## 2013-03-27 NOTE — Patient Instructions (Addendum)
Continue sinus treatment Continue acid reflux treatment Continue speech therapy Restart gabapentin  - Take gabapentin 300mg  once daily x 3 days, then 300mg  twice daily x 3 days, then 300mg  three times daily to continue. If this makes you too sleepy or drowsy call us and we will cut your medication dosing down STart QVAR or AEROSPAN 2 puff twice daily You will have to do all of above 100% compliance for cough to resolve We can discuss pneumovax/prevnar shot when you can afford it in case insurance denies  #FOllowup 4 to 6 weeks with me or NP

## 2013-04-06 NOTE — Assessment & Plan Note (Signed)
Continue sinus treatment Continue acid reflux treatment Continue speech therapy Restart gabapentin  - Take gabapentin 300mg once daily x 3 days, then 300mg twice daily x 3 days, then 300mg three times daily to continue. If this makes you too sleepy or drowsy call us and we will cut your medication dosing down STart QVAR or AEROSPAN 2 puff twice daily You will have to do all of above 100% compliance for cough to resolve We can discuss pneumovax/prevnar shot when you can afford it in case insurance denies  #FOllowup 4 to 6 weeks with me or NP       

## 2013-04-22 ENCOUNTER — Ambulatory Visit: Payer: No Typology Code available for payment source | Admitting: Internal Medicine

## 2013-05-02 ENCOUNTER — Ambulatory Visit: Payer: Self-pay | Admitting: Internal Medicine

## 2015-11-10 ENCOUNTER — Ambulatory Visit (HOSPITAL_COMMUNITY)
Admission: EM | Admit: 2015-11-10 | Discharge: 2015-11-10 | Disposition: A | Discharge status: Home / Self Care | Payer: BLUE CROSS/BLUE SHIELD | Attending: Family Medicine | Admitting: Family Medicine

## 2015-11-10 ENCOUNTER — Encounter (HOSPITAL_COMMUNITY): Payer: Self-pay | Admitting: Emergency Medicine

## 2015-11-10 DIAGNOSIS — K219 Gastro-esophageal reflux disease without esophagitis: Secondary | ICD-10-CM | POA: Diagnosis not present

## 2015-11-10 MED ORDER — GI COCKTAIL ~~LOC~~
30.0000 mL | Freq: Once | ORAL | Status: AC
  Administered 2015-11-10: 30 mL via ORAL

## 2015-11-10 MED ORDER — RANITIDINE HCL 150 MG PO TABS: 150.0000 mg | ORAL_TABLET | Freq: Two times a day (BID) | ORAL | 0 refills | Status: DC

## 2015-11-10 NOTE — ED Provider Notes (Addendum)
[facility]    CSN: [id number] Arrival date & time: 11/10/15  1000  First Provider Contact:  First MD Initiated Contact with Patient 11/10/15 1028        History   Chief Complaint Chief Complaint  Patient presents with  . Abdominal Pain    HPI Lindsey Suarez is a 60 y.o. female.    Abdominal Pain  Pain location:  Epigastric Pain quality: burning   Pain radiates to:  Does not radiate Pain severity:  Mild Onset quality:  Gradual Duration:  52 weeks Progression:  Waxing and waning Chronicity:  Chronic Ineffective treatments:  NSAIDs Associated symptoms: nausea   Associated symptoms: no anorexia, no belching, no diarrhea, no fever, no hematochezia, no melena and no vomiting     Past Medical History:  Diagnosis Date  . Arthritis   . Hypertension     Patient Active Problem List   Diagnosis Date Noted  . Chronic pain of multiple sites 11/13/2012  . Unspecified essential hypertension 11/13/2012  . GERD (gastroesophageal reflux disease) 11/13/2012  . Rib fracture 10/25/2012  . Chest pain 10/25/2012  . Abdominal pain, chronic, epigastric 10/25/2012  . Tenosynovitis of elbow 09/11/2012  . Peripheral edema 09/11/2012  . Chronic cough 09/11/2012    Past Surgical History:  Procedure Laterality Date  . ABDOMINAL HYSTERECTOMY    . FOOT SURGERY Bilateral   . TONSILLECTOMY    . TUBAL LIGATION      OB History    No data available       Home Medications    Prior to Admission medications   Medication Sig Start Date End Date Taking? Authorizing Provider  fluticasone (FLONASE) 50 MCG/ACT nasal spray Place 2 sprays into the nose daily. 11/13/12   [place] Cyndie Mull, MD  gabapentin (NEURONTIN) 300 MG capsule Take 1 tablet daily x 3 days, then 1 tablet twice daily x 3 days, then 1 tablet three times a day and continue. 03/27/13   Kalman Shan, MD  guaiFENesin (MUCINEX) 600 MG 12 hr tablet Take 2 tablets (1,200 mg total) by mouth 2 (two) times daily as  needed for congestion. 09/25/12   Alison Murray, MD  ibuprofen (ADVIL,MOTRIN) 400 MG tablet Take 400 mg by mouth every 6 (six) hours as needed.    Historical Provider, MD    Family History Family History  Problem Relation Age of Onset  . Breast cancer Mother   . Breast cancer Maternal Aunt     Social History Social History  Substance Use Topics  . Smoking status: Never Smoker  . Smokeless tobacco: Never Used  . Alcohol use Yes     Comment: social     Allergies   Sulfa antibiotics   Review of Systems Review of Systems  Constitutional: Negative.  Negative for appetite change and fever.  Gastrointestinal: Positive for abdominal pain and nausea. Negative for anorexia, diarrhea, hematochezia, melena and vomiting.  Genitourinary: Negative.   All other systems reviewed and are negative.    Physical Exam Triage Vital Signs ED Triage Vitals  Enc Vitals Group     BP 11/10/15 1030 143/80     Pulse Rate 11/10/15 1030 (!) 53     Resp 11/10/15 1030 16     Temp 11/10/15 1030 98.4 F (36.9 C)     Temp Source 11/10/15 1030 Oral     SpO2 11/10/15 1030 98 %     Weight --      Height --      Head  Circumference --      Peak Flow --      Pain Score 11/10/15 1031 6     Pain Loc --      Pain Edu? --      Excl. in GC? --    No data found.   Updated Vital Signs BP 143/80   Pulse (!) 53   Temp 98.4 F (36.9 C) (Oral)   Resp 16   SpO2 98%   Visual Acuity Right Eye Distance:   Left Eye Distance:   Bilateral Distance:    Right Eye Near:   Left Eye Near:    Bilateral Near:     Physical Exam  Constitutional: She is oriented to person, place, and time. She appears well-developed and well-nourished. No distress.  HENT:  Mouth/Throat: Oropharynx is clear and moist.  Neck: Normal range of motion. Neck supple.  Cardiovascular: Normal rate, regular rhythm, normal heart sounds and intact distal pulses.   Pulmonary/Chest: Effort normal and breath sounds normal.  Abdominal:  Soft. Bowel sounds are normal. She exhibits no distension and no mass. There is tenderness in the epigastric area. There is no rebound, no guarding and no CVA tenderness. No hernia.  Lymphadenopathy:    She has no cervical adenopathy.  Neurological: She is alert and oriented to person, place, and time.  Skin: Skin is warm and dry.  Nursing note and vitals reviewed.    UC Treatments / Results  Labs (all labs ordered are listed, but only abnormal results are displayed) Labs Reviewed - No data to display  EKG  EKG Interpretation None       Radiology No results found.  Procedures Procedures (including critical care time)  Medications Ordered in UC Medications  gi cocktail (Maalox,Lidocaine,Donnatal) (not administered)     Initial Impression / Assessment and Plan / UC Course  I have reviewed the triage vital signs and the nursing notes.  Pertinent labs & imaging results that were available during my care of the patient were reviewed by me and considered in my medical decision making (see chart for details).  Clinical Course      Final Clinical Impressions(s) / UC Diagnoses   Final diagnoses:  None    New Prescriptions New Prescriptions   No medications on file     Linna Hoff, MD 11/10/15 1044    Linna Hoff, MD 11/10/15 1045

## 2015-11-10 NOTE — ED Triage Notes (Signed)
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 PT reports intermittent nausea for 1 year. PT reports numbness and tingling in both hands for the past few months. PT also reports headaches off and on for 1 year.

## 2015-11-10 NOTE — Discharge Instructions (Signed)
Take medicine daily, see specialist if further problems.

## 2016-03-28 DIAGNOSIS — J069 Acute upper respiratory infection, unspecified: Secondary | ICD-10-CM | POA: Diagnosis not present

## 2016-03-28 DIAGNOSIS — J019 Acute sinusitis, unspecified: Secondary | ICD-10-CM | POA: Diagnosis not present

## 2016-05-16 DIAGNOSIS — J302 Other seasonal allergic rhinitis: Secondary | ICD-10-CM | POA: Diagnosis not present

## 2016-05-16 DIAGNOSIS — R030 Elevated blood-pressure reading, without diagnosis of hypertension: Secondary | ICD-10-CM | POA: Diagnosis not present

## 2016-05-16 DIAGNOSIS — Z131 Encounter for screening for diabetes mellitus: Secondary | ICD-10-CM | POA: Diagnosis not present

## 2016-05-16 DIAGNOSIS — Z13220 Encounter for screening for lipoid disorders: Secondary | ICD-10-CM | POA: Diagnosis not present

## 2016-05-23 ENCOUNTER — Other Ambulatory Visit: Payer: Self-pay | Admitting: Internal Medicine

## 2016-05-23 DIAGNOSIS — N644 Mastodynia: Secondary | ICD-10-CM

## 2016-05-23 DIAGNOSIS — E2839 Other primary ovarian failure: Secondary | ICD-10-CM

## 2016-06-20 DIAGNOSIS — J302 Other seasonal allergic rhinitis: Secondary | ICD-10-CM | POA: Diagnosis not present

## 2016-06-20 DIAGNOSIS — G4700 Insomnia, unspecified: Secondary | ICD-10-CM | POA: Diagnosis not present

## 2016-06-20 DIAGNOSIS — E669 Obesity, unspecified: Secondary | ICD-10-CM | POA: Diagnosis not present

## 2016-06-20 DIAGNOSIS — K219 Gastro-esophageal reflux disease without esophagitis: Secondary | ICD-10-CM | POA: Diagnosis not present

## 2016-07-01 ENCOUNTER — Ambulatory Visit
Admission: RE | Admit: 2016-07-01 | Discharge: 2016-07-01 | Disposition: A | Discharge status: Home / Self Care | Payer: BLUE CROSS/BLUE SHIELD | Source: Ambulatory Visit | Attending: Internal Medicine | Admitting: Internal Medicine

## 2016-07-01 DIAGNOSIS — E2839 Other primary ovarian failure: Secondary | ICD-10-CM

## 2016-07-01 DIAGNOSIS — N644 Mastodynia: Secondary | ICD-10-CM

## 2016-07-01 DIAGNOSIS — Z780 Asymptomatic menopausal state: Secondary | ICD-10-CM | POA: Diagnosis not present

## 2016-07-01 DIAGNOSIS — M8588 Other specified disorders of bone density and structure, other site: Secondary | ICD-10-CM | POA: Diagnosis not present

## 2016-08-01 DIAGNOSIS — K219 Gastro-esophageal reflux disease without esophagitis: Secondary | ICD-10-CM | POA: Diagnosis not present

## 2016-08-01 DIAGNOSIS — N644 Mastodynia: Secondary | ICD-10-CM | POA: Diagnosis not present

## 2016-08-01 DIAGNOSIS — G4700 Insomnia, unspecified: Secondary | ICD-10-CM | POA: Diagnosis not present

## 2016-08-01 DIAGNOSIS — J302 Other seasonal allergic rhinitis: Secondary | ICD-10-CM | POA: Diagnosis not present

## 2016-08-04 ENCOUNTER — Ambulatory Visit (AMBULATORY_SURGERY_CENTER): Payer: Self-pay | Admitting: *Deleted

## 2016-08-04 ENCOUNTER — Encounter: Payer: Self-pay | Admitting: Gastroenterology

## 2016-08-04 VITALS — Ht 62.0 in | Wt 158.0 lb

## 2016-08-04 DIAGNOSIS — Z1211 Encounter for screening for malignant neoplasm of colon: Secondary | ICD-10-CM

## 2016-08-04 MED ORDER — NA SULFATE-K SULFATE-MG SULF 17.5-3.13-1.6 GM/177ML PO SOLN: 1.0000 | Freq: Once | ORAL | 0 refills | Status: AC

## 2016-08-04 NOTE — Progress Notes (Signed)
No egg or soy allergy known to patient  No issues with past sedation with any surgeries  or procedures, no intubation problems  No diet pills per patient No home 02 use per patient  No blood thinners per patient  Pt states issues with constipation - stools are always hard-pt does not use OTC meds to help- will do a 2 day prep due to constipation issues  No A fib or A flutter  emmi video to e mail 15$ coupon to pt for suprep

## 2016-08-16 ENCOUNTER — Ambulatory Visit (AMBULATORY_SURGERY_CENTER): Payer: BLUE CROSS/BLUE SHIELD | Admitting: Gastroenterology

## 2016-08-16 ENCOUNTER — Encounter: Payer: Self-pay | Admitting: Gastroenterology

## 2016-08-16 VITALS — BP 132/77 | HR 88 | Temp 99.1°F | Resp 13 | Ht 62.0 in | Wt 158.0 lb

## 2016-08-16 DIAGNOSIS — Z1212 Encounter for screening for malignant neoplasm of rectum: Secondary | ICD-10-CM

## 2016-08-16 DIAGNOSIS — Z1211 Encounter for screening for malignant neoplasm of colon: Secondary | ICD-10-CM | POA: Diagnosis not present

## 2016-08-16 MED ORDER — SODIUM CHLORIDE 0.9 % IV SOLN: 500.0000 mL | INTRAVENOUS | Status: DC

## 2016-08-16 NOTE — Progress Notes (Signed)
Pt's states no medical or surgical changes since previsit or office visit. 

## 2016-08-16 NOTE — Op Note (Signed)
[facility] Patient Name: Lindsey Suarez Procedure Date: 08/16/2016 4:12 PM MRN: [id number] Endoscopist: Sherilyn Cooter L. Myrtie Neither , MD Age: 61 Referring MD:  Date of Birth: May 31, 1955 Gender: Female Account #: [id number] Procedure:                Colonoscopy Indications:              Screening for colorectal malignant neoplasm, This                            is the patient's first screening colonoscopy Medicines:                Monitored Anesthesia Care Procedure:                Pre-Anesthesia Assessment:                           - Prior to the procedure, a History and Physical                            was performed, and patient medications and                            allergies were reviewed. The patient's tolerance of                            previous anesthesia was also reviewed. The risks                            and benefits of the procedure and the sedation                            options and risks were discussed with the patient.                            All questions were answered, and informed consent                            was obtained. Prior Anticoagulants: The patient has                            taken no previous anticoagulant or antiplatelet                            agents. ASA Grade Assessment: II - A patient with                            mild systemic disease. After reviewing the risks                            and benefits, the patient was deemed in                            satisfactory condition to undergo the procedure.  After obtaining informed consent, the colonoscope                            was passed under direct vision. Throughout the                            procedure, the patient's blood pressure, pulse, and                            oxygen saturations were monitored continuously. The                            Colonoscope was introduced through the anus and                            advanced to the  the cecum, identified by                            appendiceal orifice and ileocecal valve. The                            colonoscopy was performed without difficulty. The                            patient tolerated the procedure. The quality of the                            bowel preparation was good. The ileocecal valve,                            appendiceal orifice, and rectum were photographed.                            The quality of the bowel preparation was evaluated                            using the BBPS [facility] Bowel Preparation Scale)                            with scores of: Right Colon = 2, Transverse Colon =                            2 and Left Colon = 2. The total BBPS score equals                            6. The bowel preparation used was SUPREP. Scope In: 4:28:39 PM Scope Out: 4:44:07 PM Scope Withdrawal Time: 0 hours 9 minutes 5 seconds  Total Procedure Duration: 0 hours 15 minutes 28 seconds  Findings:                 The perianal and digital rectal examinations were  normal.                           The colon (entire examined portion) was moderately                            redundant.                           The exam was otherwise without abnormality on                            direct and retroflexion views. Complications:            No immediate complications. Estimated Blood Loss:     Estimated blood loss: none. Impression:               - Redundant colon.                           - The examination was otherwise normal on direct                            and retroflexion views.                           - No specimens collected. Recommendation:           - Patient has a contact number available for                            emergencies. The signs and symptoms of potential                            delayed complications were discussed with the                            patient. Return to normal activities tomorrow.                             Written discharge instructions were provided to the                            patient.                           - Resume previous diet.                           - Continue present medications.                           - Repeat colonoscopy in 10 years for screening                            purposes. Mirza Fessel L. Myrtie Neither, MD 08/16/2016 4:48:52 PM This report has been signed electronically.

## 2016-08-16 NOTE — Patient Instructions (Signed)
Discharge instructions given. Normal exam. resume previous  Medications. YOU HAD AN ENDOSCOPIC PROCEDURE TODAY AT THE [facility]:   Refer to the procedure report that was given to you for any specific questions about what was found during the examination.  If the procedure report does not answer your questions, please call your gastroenterologist to clarify.  If you requested that your care partner not be given the details of your procedure findings, then the procedure report has been included in a sealed envelope for you to review at your convenience later.  YOU SHOULD EXPECT: Some feelings of bloating in the abdomen. Passage of more gas than usual.  Walking can help get rid of the air that was put into your GI tract during the procedure and reduce the bloating. If you had a lower endoscopy (such as a colonoscopy or flexible sigmoidoscopy) you may notice spotting of blood in your stool or on the toilet paper. If you underwent a bowel prep for your procedure, you may not have a normal bowel movement for a few days.  Please Note:  You might notice some irritation and congestion in your nose or some drainage.  This is from the oxygen used during your procedure.  There is no need for concern and it should clear up in a day or so.  SYMPTOMS TO REPORT IMMEDIATELY:   Following lower endoscopy (colonoscopy or flexible sigmoidoscopy):  Excessive amounts of blood in the stool  Significant tenderness or worsening of abdominal pains  Swelling of the abdomen that is new, acute  Fever of 100F or higher  For urgent or emergent issues, a gastroenterologist can be reached at any hour by calling [phone].   DIET:  We do recommend a small meal at first, but then you may proceed to your regular diet.  Drink plenty of fluids but you should avoid alcoholic beverages for 24 hours.  ACTIVITY:  You should plan to take it easy for the rest of today and you should NOT DRIVE or use heavy machinery  until tomorrow (because of the sedation medicines used during the test).    FOLLOW UP: Our staff will call the number listed on your records the next business day following your procedure to check on you and address any questions or concerns that you may have regarding the information given to you following your procedure. If we do not reach you, we will leave a message.  However, if you are feeling well and you are not experiencing any problems, there is no need to return our call.  We will assume that you have returned to your regular daily activities without incident.  If any biopsies were taken you will be contacted by phone or by letter within the next 1-3 weeks.  Please call us at [phone] if you have not heard about the biopsies in 3 weeks.    SIGNATURES/CONFIDENTIALITY: You and/or your care partner have signed paperwork which will be entered into your electronic medical record.  These signatures attest to the fact that that the information above on your After Visit Summary has been reviewed and is understood.  Full responsibility of the confidentiality of this discharge information lies with you and/or your care-partner.

## 2016-08-16 NOTE — Progress Notes (Signed)
Spontaneous respirations throughout. VSS. Resting comfortably. To PACU on room air. Report to  Celia RN. 

## 2016-08-17 ENCOUNTER — Telehealth: Payer: Self-pay | Admitting: *Deleted

## 2016-08-17 NOTE — Telephone Encounter (Signed)
  Follow up Call-  Call back number 08/16/2016  Post procedure Call Back phone  # [phone]  Permission to leave phone message Yes  Some recent data might be hidden     Patient questions:  Do you have a fever, pain , or abdominal swelling? No. Pain Score  0 *  Have you tolerated food without any problems? Yes.    Have you been able to return to your normal activities? Yes.    Do you have any questions about your discharge instructions: Diet   No. Medications  No. Follow up visit  No.  Do you have questions or concerns about your Care? No.  Actions: * If pain score is 4 or above: No action needed, pain <4.

## 2016-08-17 NOTE — Telephone Encounter (Signed)
No answer. Will attempt to call back later this afternoon for follow up call. SM

## 2016-09-22 DIAGNOSIS — R109 Unspecified abdominal pain: Secondary | ICD-10-CM | POA: Diagnosis not present

## 2016-09-22 DIAGNOSIS — K219 Gastro-esophageal reflux disease without esophagitis: Secondary | ICD-10-CM | POA: Diagnosis not present

## 2016-09-22 DIAGNOSIS — K5900 Constipation, unspecified: Secondary | ICD-10-CM | POA: Diagnosis not present

## 2016-09-22 DIAGNOSIS — N3080 Other cystitis without hematuria: Secondary | ICD-10-CM | POA: Diagnosis not present

## 2016-10-05 DIAGNOSIS — K5900 Constipation, unspecified: Secondary | ICD-10-CM | POA: Diagnosis not present

## 2016-10-05 DIAGNOSIS — R109 Unspecified abdominal pain: Secondary | ICD-10-CM | POA: Diagnosis not present

## 2016-10-06 DIAGNOSIS — R109 Unspecified abdominal pain: Secondary | ICD-10-CM | POA: Diagnosis not present

## 2016-10-12 DIAGNOSIS — K219 Gastro-esophageal reflux disease without esophagitis: Secondary | ICD-10-CM | POA: Diagnosis not present

## 2016-10-12 DIAGNOSIS — J302 Other seasonal allergic rhinitis: Secondary | ICD-10-CM | POA: Diagnosis not present

## 2016-10-12 DIAGNOSIS — R200 Anesthesia of skin: Secondary | ICD-10-CM | POA: Diagnosis not present

## 2016-11-29 DIAGNOSIS — R202 Paresthesia of skin: Secondary | ICD-10-CM | POA: Diagnosis not present

## 2016-12-06 ENCOUNTER — Ambulatory Visit: Payer: BLUE CROSS/BLUE SHIELD | Admitting: Gastroenterology

## 2016-12-15 DIAGNOSIS — G5603 Carpal tunnel syndrome, bilateral upper limbs: Secondary | ICD-10-CM | POA: Diagnosis not present

## 2017-01-09 ENCOUNTER — Telehealth: Payer: Self-pay | Admitting: Gastroenterology

## 2017-01-09 ENCOUNTER — Ambulatory Visit (INDEPENDENT_AMBULATORY_CARE_PROVIDER_SITE_OTHER): Payer: BLUE CROSS/BLUE SHIELD | Admitting: Gastroenterology

## 2017-01-09 ENCOUNTER — Encounter: Payer: Self-pay | Admitting: Gastroenterology

## 2017-01-09 VITALS — BP 110/72 | HR 84 | Ht 62.0 in | Wt 158.0 lb

## 2017-01-09 DIAGNOSIS — R1084 Generalized abdominal pain: Secondary | ICD-10-CM

## 2017-01-09 DIAGNOSIS — K581 Irritable bowel syndrome with constipation: Secondary | ICD-10-CM | POA: Diagnosis not present

## 2017-01-09 DIAGNOSIS — K219 Gastro-esophageal reflux disease without esophagitis: Secondary | ICD-10-CM | POA: Diagnosis not present

## 2017-01-09 NOTE — Progress Notes (Addendum)
Lindsey Suarez  Chief Complaint: Abdominal pain and constipation  Subjective  History:  This is a 61 year old woman referred back to see [place] by primary care for chronic abdominal pain and constipation. She reports that it is been going on for many years. She has generalized abdominal pain that varies in location and is worse if she has not had a bowel movement for 5-6 days. Sometime she'll go 2 weeks between bowel movements. She was referred to [place] for screening colonoscopy, which I did on 08/16/2016. This study was normal. She reports that her mother and sisters have similar digestive problems. All the symptoms are certainly worse with stress, and she apparently had little or no improvement at Amitiza (unknown dose). She also has years of chronic heartburn which was well controlled on Zegerid. She was switched to  Dexilant, but it is too expensive. She denies dysphagia, odynophagia, early satiety, nausea, vomiting or weight loss  ROS: Cardiovascular:  no chest pain Respiratory: no dyspnea  The patient's Past Medical, Family and Social History were reviewed and are on file in the EMR.  Objective:  Med list reviewed  Current Outpatient Prescriptions:  .  acetaminophen (TYLENOL) 500 MG tablet, Take 500 mg by mouth every 6 (six) hours as needed., Disp: , Rfl:  .  amitriptyline (ELAVIL) 25 MG tablet, Take 25 mg by mouth at bedtime., Disp: , Rfl:  .  Ascorbic Acid (VITAMIN C) 1000 MG tablet, Take 1,000 mg by mouth daily., Disp: , Rfl:  .  cetirizine (ZYRTEC) 10 MG tablet, Take 10 mg by mouth daily., Disp: , Rfl:  .  dexlansoprazole (DEXILANT) 60 MG capsule, Take 60 mg by mouth daily., Disp: , Rfl:  .  Ginkgo Biloba (GINKOBA) 40 MG TABS, Take 1 tablet by mouth daily., Disp: , Rfl:  .  LINZESS 145 MCG CAPS capsule, Take 1 capsule by mouth daily., Disp: , Rfl:   Current Facility-Administered Medications:  .  0.9 %  sodium chloride infusion, 500 mL, Intravenous, Continuous,  Danis, Starr Lake III, MD   Vital signs in last 24 hrs: Vitals:   01/09/17 1047  BP: 110/72  Pulse: 84    Physical Exam    HEENT: sclera anicteric, oral mucosa moist without lesions  Neck: supple, no thyromegaly, JVD or lymphadenopathy  Cardiac: RRR without murmurs, S1S2 heard, no peripheral edema  Pulm: clear to auscultation bilaterally, normal RR and effort noted  Abdomen: soft, No tenderness, with active bowel sounds. No guarding or palpable hepatosplenomegaly.  Skin; warm and dry, no jaundice or rash  No recent data for review  @ Assessment: Encounter Diagnoses  Name Primary?  . Irritable bowel syndrome with constipation Yes  . Generalized abdominal pain   . Gastroesophageal reflux disease, esophagitis presence not specified    Her symptom complex is most consistent with constipation type IBS, I reassured her that there is no obstruction colonoscopy.  Treatment plan might be challenging since she finds it difficult to take medicines that might work unpredictably, given her work on an Occupational [employer]. She has used MiraLAX in the past, says it works for a short while and then she becomes "immune".   Plan:  A trial of Linzess 145 g once daily for 2 weeks I have asked her to call us in 2 weeks with an update. If she is not much improved, she may need a regimen where she can take something on days off from work to get periodic relief of constipation.  Total time 25 minutes, over half spent in counseling and coordination of care.   Charlie Pitter III

## 2017-01-09 NOTE — Patient Instructions (Signed)
If Dexilant is too expensive, you can purchase generic Zegerid over the counter at your pharmacy.  We have given you samples of Linzess 145 micrograms to take once daily.  Please let us know how it works for you.

## 2017-01-10 ENCOUNTER — Other Ambulatory Visit: Payer: Self-pay

## 2017-01-10 MED ORDER — LINACLOTIDE 145 MCG PO CAPS: ORAL_CAPSULE | ORAL | 0 refills | Status: DC

## 2017-01-10 NOTE — Telephone Encounter (Signed)
Pharmacy has been updated.

## 2017-01-20 ENCOUNTER — Telehealth: Payer: Self-pay | Admitting: Gastroenterology

## 2017-01-23 NOTE — Telephone Encounter (Signed)
Stop the Linzess if not helping.  Please give her instructions for a  Miralax bowel prep, which can be taken with or without the ducolax. Might be easier to mail it to her.  Best to start with just the first half of the prep (what would usually be the evening doses).  Can take the other half the following day if needed. This can be done as often as once a week if needed to relieve constipation.

## 2017-01-23 NOTE — Telephone Encounter (Signed)
Pt returned call. Lindsey Suarez states that she is taking the Linzess 145 mcg daily and her symptoms have not improved. She asks about a plan you had with her about taking something on her days off to give her constipation relief. Please advise.

## 2017-01-23 NOTE — Telephone Encounter (Signed)
Left message to return call 

## 2017-01-25 NOTE — Telephone Encounter (Signed)
Pt has been notified and aware. She still has her previous colonoscopy prep instructions ( active on my chart also). She will give the Miralax purge a try.

## 2017-07-26 ENCOUNTER — Telehealth: Payer: Self-pay | Admitting: Gastroenterology

## 2017-07-26 NOTE — Telephone Encounter (Signed)
Pt states she was diagnosed with IBS and she is having issues now with lots of bloating and constipation. States she was given lizess 145 but this did not help, it just caused her to have a lot of gas. She tried dulcolax and reports this only produced a very small stool. Pt cannot remember the last time she had a "normal BM". Pt is requesting something be called in for her. Please advise.

## 2017-07-27 MED ORDER — LINACLOTIDE 290 MCG PO CAPS: 290.0000 ug | ORAL_CAPSULE | Freq: Every day | ORAL | 0 refills | Status: DC

## 2017-07-27 NOTE — Telephone Encounter (Signed)
Pt aware, samples up front for pt to pickup, pt scheduled to see Dr. Myrtie Neitheranis 08/29/17@11 :30am.

## 2017-07-27 NOTE — Telephone Encounter (Signed)
Please give her instructions for how to make a Miralax bowel prep as if she was going to have a colonsocopy.  Clearly, must be done when she is off work.  Then she can pick up samples of Linzess 290 microgram tablets, take one daily.  Make office appointment for follow up.

## 2017-08-29 ENCOUNTER — Ambulatory Visit: Payer: BLUE CROSS/BLUE SHIELD | Admitting: Gastroenterology

## 2017-09-21 DIAGNOSIS — H18832 Recurrent erosion of cornea, left eye: Secondary | ICD-10-CM | POA: Diagnosis not present

## 2017-09-26 ENCOUNTER — Ambulatory Visit: Payer: BLUE CROSS/BLUE SHIELD | Admitting: Gastroenterology

## 2017-10-02 DIAGNOSIS — Z13220 Encounter for screening for lipoid disorders: Secondary | ICD-10-CM | POA: Diagnosis not present

## 2017-10-02 DIAGNOSIS — Z131 Encounter for screening for diabetes mellitus: Secondary | ICD-10-CM | POA: Diagnosis not present

## 2017-10-02 DIAGNOSIS — K219 Gastro-esophageal reflux disease without esophagitis: Secondary | ICD-10-CM | POA: Diagnosis not present

## 2017-10-02 DIAGNOSIS — J302 Other seasonal allergic rhinitis: Secondary | ICD-10-CM | POA: Diagnosis not present

## 2017-10-02 DIAGNOSIS — K5900 Constipation, unspecified: Secondary | ICD-10-CM | POA: Diagnosis not present

## 2017-10-02 DIAGNOSIS — G4700 Insomnia, unspecified: Secondary | ICD-10-CM | POA: Diagnosis not present

## 2017-10-05 ENCOUNTER — Other Ambulatory Visit: Payer: Self-pay | Admitting: Internal Medicine

## 2017-10-05 DIAGNOSIS — E2839 Other primary ovarian failure: Secondary | ICD-10-CM

## 2018-01-23 ENCOUNTER — Other Ambulatory Visit: Payer: Self-pay | Admitting: Internal Medicine

## 2018-01-23 DIAGNOSIS — Z1231 Encounter for screening mammogram for malignant neoplasm of breast: Secondary | ICD-10-CM

## 2018-02-08 DIAGNOSIS — G5603 Carpal tunnel syndrome, bilateral upper limbs: Secondary | ICD-10-CM | POA: Diagnosis not present

## 2018-02-08 DIAGNOSIS — Z23 Encounter for immunization: Secondary | ICD-10-CM | POA: Diagnosis not present

## 2018-02-26 ENCOUNTER — Ambulatory Visit
Admission: RE | Admit: 2018-02-26 | Discharge: 2018-02-26 | Disposition: A | Discharge status: Home / Self Care | Payer: BLUE CROSS/BLUE SHIELD | Source: Ambulatory Visit | Attending: Internal Medicine | Admitting: Internal Medicine

## 2018-02-26 DIAGNOSIS — Z1231 Encounter for screening mammogram for malignant neoplasm of breast: Secondary | ICD-10-CM

## 2018-03-12 DIAGNOSIS — N3001 Acute cystitis with hematuria: Secondary | ICD-10-CM | POA: Diagnosis not present

## 2018-03-12 DIAGNOSIS — K5900 Constipation, unspecified: Secondary | ICD-10-CM | POA: Diagnosis not present

## 2018-03-12 DIAGNOSIS — J302 Other seasonal allergic rhinitis: Secondary | ICD-10-CM | POA: Diagnosis not present

## 2018-03-12 DIAGNOSIS — K219 Gastro-esophageal reflux disease without esophagitis: Secondary | ICD-10-CM | POA: Diagnosis not present

## 2021-03-01 ENCOUNTER — Ambulatory Visit (INDEPENDENT_AMBULATORY_CARE_PROVIDER_SITE_OTHER): Payer: 59 | Admitting: Family

## 2021-03-01 ENCOUNTER — Other Ambulatory Visit: Payer: Self-pay

## 2021-03-01 ENCOUNTER — Encounter: Payer: Self-pay | Admitting: Family

## 2021-03-01 VITALS — BP 145/87 | HR 80 | Temp 98.0°F | Ht 62.0 in | Wt 197.6 lb

## 2021-03-01 DIAGNOSIS — I10 Essential (primary) hypertension: Secondary | ICD-10-CM

## 2021-03-01 DIAGNOSIS — N898 Other specified noninflammatory disorders of vagina: Secondary | ICD-10-CM

## 2021-03-01 DIAGNOSIS — Z0000 Encounter for general adult medical examination without abnormal findings: Secondary | ICD-10-CM | POA: Diagnosis not present

## 2021-03-01 DIAGNOSIS — K219 Gastro-esophageal reflux disease without esophagitis: Secondary | ICD-10-CM | POA: Diagnosis not present

## 2021-03-01 DIAGNOSIS — F4381 Prolonged grief disorder: Secondary | ICD-10-CM | POA: Insufficient documentation

## 2021-03-01 LAB — CBC WITH DIFFERENTIAL/PLATELET
Basophils Absolute: 0.1 10*3/uL (ref 0.0–0.1)
Basophils Relative: 0.8 % (ref 0.0–3.0)
Eosinophils Absolute: 0 10*3/uL (ref 0.0–0.7)
Eosinophils Relative: 0.5 % (ref 0.0–5.0)
HCT: 42.9 % (ref 36.0–46.0)
Hemoglobin: 14.4 g/dL (ref 12.0–15.0)
Lymphocytes Relative: 24.3 % (ref 12.0–46.0)
Lymphs Abs: 2 10*3/uL (ref 0.7–4.0)
MCHC: 33.5 g/dL (ref 30.0–36.0)
MCV: 83.9 fl (ref 78.0–100.0)
Monocytes Absolute: 0.5 10*3/uL (ref 0.1–1.0)
Monocytes Relative: 6.2 % (ref 3.0–12.0)
Neutro Abs: 5.5 10*3/uL (ref 1.4–7.7)
Neutrophils Relative %: 68.2 % (ref 43.0–77.0)
Platelets: 373 10*3/uL (ref 150.0–400.0)
RBC: 5.12 Mil/uL — ABNORMAL HIGH (ref 3.87–5.11)
RDW: 12.9 % (ref 11.5–15.5)
WBC: 8.1 10*3/uL (ref 4.0–10.5)

## 2021-03-01 LAB — TSH: TSH: 3.78 u[IU]/mL (ref 0.35–5.50)

## 2021-03-01 LAB — LIPID PANEL
Cholesterol: 176 mg/dL (ref 0–200)
HDL: 45.6 mg/dL (ref >39.00)
LDL Cholesterol: 113 mg/dL — ABNORMAL HIGH (ref 0–99)
NonHDL: 130.22
Total CHOL/HDL Ratio: 4
Triglycerides: 86 mg/dL (ref 0.0–149.0)
VLDL: 17.2 mg/dL (ref 0.0–40.0)

## 2021-03-01 LAB — COMPREHENSIVE METABOLIC PANEL
ALT: 28 U/L (ref 0–35)
AST: 25 U/L (ref 0–37)
Albumin: 4.6 g/dL (ref 3.5–5.2)
Alkaline Phosphatase: 93 U/L (ref 39–117)
BUN: 13 mg/dL (ref 6–23)
CO2: 28 mEq/L (ref 19–32)
Calcium: 9.9 mg/dL (ref 8.4–10.5)
Chloride: 103 mEq/L (ref 96–112)
Creatinine, Ser: 0.89 mg/dL (ref 0.40–1.20)
GFR: 68.65 mL/min (ref >60.00)
Glucose, Bld: 94 mg/dL (ref 70–99)
Potassium: 4.4 mEq/L (ref 3.5–5.1)
Sodium: 139 mEq/L (ref 135–145)
Total Bilirubin: 0.4 mg/dL (ref 0.2–1.2)
Total Protein: 8.5 g/dL — ABNORMAL HIGH (ref 6.0–8.3)

## 2021-03-01 MED ORDER — HYDROCHLOROTHIAZIDE 12.5 MG PO TABS: 12.5000 mg | ORAL_TABLET | Freq: Every morning | ORAL | 3 refills | Status: DC

## 2021-03-01 NOTE — Assessment & Plan Note (Signed)
Controlled with OTC Nexium 20mg  qd

## 2021-03-01 NOTE — Assessment & Plan Note (Addendum)
Reports taking 3 mos of medication and told by previous PCP to stop taking. Pt reports about 5-10lb weight gain over the last year. Starting low dose HCTZ, pt instructed on use & SE. F/u in 1 mo.

## 2021-03-01 NOTE — Assessment & Plan Note (Addendum)
Mother died a year ago and still having a lot of sadness and difficulty sleeping. Did not seek counseling, discussed options with pt including reaching out to church community. Pt does not want any meds at this time. F/U in 1 month.

## 2021-03-01 NOTE — Patient Instructions (Addendum)
Welcome to [facility]! It was a pleasure meeting you today.  As discussed, I have sent a medication to your pharmacy for your blood pressure, start this today, then take daily in the mornings. I have ordered a mammogram and [facility] will call [facility] directly for an appointment.  Please read the handouts attached that may offer some helpful information.  Please schedule a 1 month follow up visit today.   PLEASE NOTE:  If you had any LAB tests please let us know if you have not heard back within a few days. You may see your results on MyChart before we have a chance to review them but we will give you a call once they are reviewed by [place]. If we ordered any REFERRALS today, please let us know if you have not heard from their office within the next week.  Let us know through MyChart if you are needing REFILLS, or have your pharmacy send [place] the request. You can also use MyChart to communicate with me or any office staff.  Please try these tips to maintain a healthy lifestyle:  Eat most of your calories during the day when you are active. Eliminate processed foods including packaged sweets (pies, cakes, cookies), reduce intake of potatoes, white bread, white pasta, and white rice. Look for whole grain options, oat flour or almond flour.  Each meal should contain half fruits/vegetables, one quarter protein, and one quarter carbs (no bigger than a computer mouse).  Cut down on sweet beverages. This includes juice, soda, and sweet tea. Also watch fruit intake, though this is a healthier sweet option, it still contains natural sugar! Limit to 3 servings daily.  Drink at least 1 glass of water with each meal and aim for at least 8 glasses per day  Exercise at least 150 minutes every week.

## 2021-03-01 NOTE — Assessment & Plan Note (Signed)
Pt retired, living in a retired living situation.

## 2021-03-01 NOTE — Progress Notes (Signed)
Phone [phone]   Subjective:   Patient is a 65 y.o. female presenting for annual physical.    Chief Complaint  Patient presents with   Annual Exam   Depression    Mother passed a year ago.    Vaginal Pain    Pt says that she has a growth on her Labia that was removed, but has since grown back. She has some aggravation.    ROS- full  review of systems was completed and negative except for: several acute problems. Hypertension: Patient is currently maintained on the following medications for blood pressure: None. Patient reports good compliance with blood pressure medications in the past. Not currently taking, took for 28mos. and told she could stop. Patient denies chest pain, shortness of breath or swelling. Last 3 blood pressure readings in our office are as follows: BP Readings from Last 3 Encounters:  03/01/21 (!) 145/87  01/09/17 110/72  08/16/16 132/77  Anxiety/Depression: Patient complains of  prolonged grief .   She has the following symptoms:  feelings of sadness .  Onset of symptoms was approximately 1 year ago, She denies current suicidal and homicidal ideation.  Possible organic causes contributing are: none.  Risk factors: negative life event mother's death and previous episode of depression (postpartum). Previous treatment includes  none  and none.   Skin lesion: Patient complains of skin lesion. The ulcer is located on the  inside of her left labia majora . Lesion has been present 3 months. Pain is rated 0/10. Interventions to date: seen by surgery several years ago. Patient any pain, itching, or drainage.    Depression screen PHQ 2/9 03/01/2021  Decreased Interest 2  Down, Depressed, Hopeless 2  PHQ - 2 Score 4  Altered sleeping 3  Tired, decreased energy 3  Change in appetite 2  Feeling bad or failure about yourself  0  Trouble concentrating 1  Moving slowly or fidgety/restless 1  Suicidal thoughts 0  PHQ-9 Score 14  Difficult doing work/chores  Somewhat difficult    The following were reviewed and entered/updated in epic: Past Medical History:  Diagnosis Date   Allergy    Anemia    age [age] . 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    FOOT SURGERY Bilateral    TONSILLECTOMY     TUBAL LIGATION     UPPER GASTROINTESTINAL ENDOSCOPY      Family History  Problem Relation Age of Onset   Breast cancer Mother    Heart disease Mother    Heart attack Mother    Hypertension Mother    Breast cancer Maternal Aunt    Other Daughter 69       cardiac arrest   Colon cancer Neg Hx  Colon polyps Neg Hx    Esophageal cancer Neg Hx    Rectal cancer Neg Hx    Stomach cancer Neg Hx     Medications- reviewed and updated Current Outpatient Medications  Medication Sig Dispense Refill   acetaminophen (TYLENOL) 500 MG tablet Take 500 mg by mouth every 6 (six) hours as needed.     Ascorbic Acid (VITAMIN C) 1000 MG tablet Take 1,000 mg by mouth daily.     Cholecalciferol (VITAMIN D3) 1.25 MG (50000 UT) TABS Take by mouth.     esomeprazole (NEXIUM) 20 MG capsule Take 20 mg by mouth daily at 12 noon.     fexofenadine (ALLEGRA) 180 MG tablet Take 180 mg by mouth daily.     hydrochlorothiazide (HYDRODIURIL) 12.5 MG tablet  Take 1 tablet (12.5 mg total) by mouth in the morning. 30 tablet 3   Misc Natural Products (OSTEO BI-FLEX/5-LOXIN ADVANCED) TABS Take by mouth.     Oxymetazoline HCl (NASAL SPRAY) 0.05 % SOLN Place into the nose.     Specialty Vitamins Products (BIOTIN PLUS KERATIN) 10000-100 MCG-MG TABS Take by mouth.     No current facility-administered medications for this visit.    Allergies-reviewed and updated Allergies  Allergen Reactions   Asa [Aspirin] Other (See Comments)    jittery   Benadryl [Diphenhydramine]    Claritin [Loratadine]    Latex    Sulfa Antibiotics Hives    Social History   Social History Narrative   Not on file   Objective  Objective:  BP (!) 145/87   Pulse 80   Temp 98 F (36.7 C) (Temporal)   Ht 5\' 2"  (1.575 m)   Wt 197 lb 9.6 oz (89.6 kg)   SpO2 96%   BMI 36.14 kg/m  Gen: NAD, resting comfortably HEENT: Mucous membranes are moist. Oropharynx normal Neck: no thyromegaly CV: RRR no murmurs rubs or gallops Lungs: CTAB no crackles, wheeze, rhonchi Abdomen: soft/nontender/nondistended/normal bowel sounds. No rebound or guarding.  Ext: no edema Skin: warm, dry Neuro: grossly normal, moves all extremities, PERRLA   Assessment and Plan   Health Maintenance counseling: 1. Anticipatory guidance: Patient counseled regarding regular dental exams q6 months, eye exams,  avoiding smoking and second hand smoke, limiting alcohol to 1 beverage per day, no illicit drugs.   2. Risk factor reduction:  Advised patient of need for regular exercise and diet rich and fruits and vegetables to reduce risk of heart attack and stroke. Exercise- none.  Wt Readings from Last 3 Encounters:  03/01/21 197 lb 9.6 oz (89.6 kg)  01/09/17 158 lb (71.7 kg)  08/16/16 158 lb (71.7 kg)   3. Immunizations/screenings/ancillary studies Immunization History  Administered Date(s) Administered   Influenza,inj,Quad PF,6+ Mos 01/24/2013   Influenza-Unspecified 01/13/2021   MMR 01/17/2012    Moderna Sars-Covid-2 Vaccination 07/24/2019, 02/19/2020, 08/24/2020, 01/13/2021   Tdap 01/17/2012   Health Maintenance Due  Topic Date Due   Hepatitis C Screening  Never done   Zoster Vaccines- Shingrix (1 of 2) Never done   PAP SMEAR-Modifier  05/19/2013   MAMMOGRAM  02/27/2020   4. Cervical cancer screening- 2015 - [facility] 5. Breast cancer screening-  mammogram- 2021 6. Colon cancer screening - 2018 7. Skin cancer screening- advised regular sunscreen use. Denies worrisome, changing, or new skin lesions.  8. Birth control/STD check- N/A 9. Osteoporosis screening- none 10. Smoking associated screening - non- smoker  Problem List Items Addressed This Visit       Cardiovascular and Mediastinum  Essential hypertension    Reports taking 3 mos of medication and told by previous PCP to stop taking. Pt reports about 5-10lb weight gain over the last year. Starting low dose HCTZ, pt instructed on use & SE. F/u in 1 mo.      Relevant Medications   hydrochlorothiazide (HYDRODIURIL) 12.5 MG tablet     Digestive   GERD (gastroesophageal reflux disease)    Controlled with OTC Nexium $RemoveBe'20mg'SBbIilmBD$  qd      Relevant Medications   esomeprazole (NEXIUM) 20 MG capsule     Other   Annual physical exam - Primary    Pt retired, living in a retired living situation.       Relevant Orders   Comprehensive metabolic panel (Completed)   TSH (Completed)   Lipid panel (Completed)   CBC with Differential/Platelet (Completed)   MM DIGITAL SCREENING BILATERAL   Vaginal lesion    Reports having in past, non-tender, located on inside left labia majora, seen by GYN in [place] when she lived there and it was removed, now has returned. Denies itching, drainage or pain, reports is light brown in color and raised.      Relevant Orders   Ambulatory referral to Obstetrics / Gynecology   Grief reaction with prolonged bereavement    Mother died a year ago and still having a lot of sadness and difficulty sleeping. Did not  seek counseling, discussed options with pt including reaching out to church community. Pt does not want any meds at this time. F/U in 1 month.         Recommended follow up: Return in about 4 weeks (around 03/29/2021) for HTN, depression, insomnia. Future Appointments  Date Time Provider [place]  04/05/2021 10:30 AM Jeanie Sewer, NP [facility]    Lab/Order associations:non-fasting   ICD-10-CM   1. Annual physical exam  Z00.00 Comprehensive metabolic panel    TSH    Lipid panel    CBC with Differential/Platelet    MM DIGITAL SCREENING BILATERAL    2. Essential hypertension  I10 hydrochlorothiazide (HYDRODIURIL) 12.5 MG tablet    3. Vaginal lesion  N89.8 Ambulatory referral to Obstetrics / Gynecology    4. Gastroesophageal reflux disease, unspecified whether esophagitis present  K21.9     5. Grief reaction with prolonged bereavement  F43.81       Meds ordered this encounter  Medications   hydrochlorothiazide (HYDRODIURIL) 12.5 MG tablet    Sig: Take 1 tablet (12.5 mg total) by mouth in the morning.    Dispense:  30 tablet    Refill:  3    Order Specific Question:   Supervising Provider    Answer:   ANDY, CAMILLE L [[id number]]     Jeanie Sewer, NP

## 2021-03-01 NOTE — Assessment & Plan Note (Addendum)
Reports having in past, non-tender, located on inside left labia majora, seen by GYN in [place] when she lived there and it was removed, now has returned. Denies itching, drainage or pain, reports is light brown in color and raised.

## 2021-03-25 ENCOUNTER — Other Ambulatory Visit: Payer: Self-pay

## 2021-03-25 ENCOUNTER — Ambulatory Visit (INDEPENDENT_AMBULATORY_CARE_PROVIDER_SITE_OTHER): Payer: 59 | Admitting: Nurse Practitioner

## 2021-03-25 ENCOUNTER — Encounter: Payer: Self-pay | Admitting: Nurse Practitioner

## 2021-03-25 VITALS — BP 132/82 | HR 98 | Ht 62.0 in | Wt 194.0 lb

## 2021-03-25 DIAGNOSIS — N9089 Other specified noninflammatory disorders of vulva and perineum: Secondary | ICD-10-CM

## 2021-03-25 NOTE — Progress Notes (Signed)
   Acute Office Visit  Subjective:    Patient ID: Lindsey Suarez, female    DOB: 1956-01-02, 65 y.o.   MRN: [id number]   HPI 65 y.o. presents today as new patient for vulvar bump that she noticed a couple of months ago after shaving. It is not painful or bothersome. She reports having an area removed or biopsied many years ago in the 80s/90s. She is not sure if it is in same location or similar in nature at all. No history of pre-cancerous or cancer lesions.    Review of Systems  Constitutional: Negative.   Genitourinary:  Negative for genital sores, vaginal discharge and vaginal pain.       Labial bump      Objective:    Physical Exam Constitutional:      Appearance: Normal appearance.  Genitourinary:    Labia:        Right: No rash, tenderness, lesion or injury.        Left: No rash, tenderness, lesion or injury.       BP 132/82   Pulse 98   Ht 5\' 2"  (1.575 m)   Wt 194 lb (88 kg)   SpO2 96%   BMI 35.48 kg/m  Wt Readings from Last 3 Encounters:  03/25/21 194 lb (88 kg)  03/01/21 197 lb 9.6 oz (89.6 kg)  01/09/17 158 lb (71.7 kg)        Assessment & Plan:   Problem List Items Addressed This Visit   None Visit Diagnoses     Skin tag of labia    -  Primary      Plan: Reassurance provided on benign appearing area consistent with skin tag. It is not bothersome, but if it becomes so she was provided with the option to remove. She is agreeable to plan.      01/11/17 DNP, 1:13 PM 03/25/2021

## 2021-04-02 ENCOUNTER — Other Ambulatory Visit: Payer: Self-pay

## 2021-04-05 ENCOUNTER — Encounter: Payer: Self-pay | Admitting: Family

## 2021-04-05 ENCOUNTER — Other Ambulatory Visit: Payer: Self-pay

## 2021-04-05 ENCOUNTER — Ambulatory Visit (INDEPENDENT_AMBULATORY_CARE_PROVIDER_SITE_OTHER): Payer: 59 | Admitting: Family

## 2021-04-05 VITALS — BP 140/70 | HR 60 | Temp 97.9°F | Ht 62.0 in | Wt 194.4 lb

## 2021-04-05 DIAGNOSIS — F4381 Prolonged grief disorder: Secondary | ICD-10-CM | POA: Diagnosis not present

## 2021-04-05 DIAGNOSIS — G4700 Insomnia, unspecified: Secondary | ICD-10-CM | POA: Diagnosis not present

## 2021-04-05 DIAGNOSIS — I10 Essential (primary) hypertension: Secondary | ICD-10-CM

## 2021-04-05 MED ORDER — AMITRIPTYLINE HCL 25 MG PO TABS: 25.0000 mg | ORAL_TABLET | Freq: Every day | ORAL | 0 refills | Status: DC

## 2021-04-05 MED ORDER — LOSARTAN POTASSIUM 25 MG PO TABS: 25.0000 mg | ORAL_TABLET | Freq: Every day | ORAL | 0 refills | Status: DC

## 2021-04-05 MED ORDER — HYDROCHLOROTHIAZIDE 12.5 MG PO TABS: 12.5000 mg | ORAL_TABLET | Freq: Every morning | ORAL | 0 refills | Status: DC

## 2021-04-05 NOTE — Assessment & Plan Note (Signed)
Reports she is doing ok, still refusing meds or counseling referral for now.

## 2021-04-05 NOTE — Patient Instructions (Addendum)
It was very nice to see you today!  I have sent a new medication for your blood pressure to your pharmacy, you can start this today. I also sent a refill of the Amitriptyline for your insomnia. As discussed, if 1 pill is not helping you fall asleep, then you can try 2 pills and let me know.  Have a wonderful Christmas!   PLEASE NOTE:  If you had any lab tests please let us know if you have not heard back within a few days. You may see your results on MyChart before we have a chance to review them but we will give you a call once they are reviewed by [place]. If we ordered any referrals today, please let us know if you have not heard from their office within the next week.   Please try these tips to maintain a healthy lifestyle:  Eat most of your calories during the day when you are active. Eliminate processed foods including packaged sweets (pies, cakes, cookies), reduce intake of potatoes, white bread, white pasta, and white rice. Look for whole grain options, oat flour or almond flour.  Each meal should contain half fruits/vegetables, one quarter protein, and one quarter carbs (no bigger than a computer mouse).  Cut down on sweet beverages. This includes juice, soda, and sweet tea. Also watch fruit intake, though this is a healthier sweet option, it still contains natural sugar! Limit to 3 servings daily.  Drink at least 1 glass of water with each meal and aim for at least 8 glasses per day  Exercise at least 150 minutes every week.

## 2021-04-05 NOTE — Progress Notes (Signed)
Subjective:     Patient ID: Lindsey Suarez, female    DOB: [birth year], 65 y.o.   MRN: [id number]  Chief Complaint  Patient presents with   Hypertension   Depression   Insomnia    HPI: INSOMNIA:  How long: years. Difficulty initiating sleep: yes.  Difficulty maintaining sleep: no.  OTC meds tried: none. RX meds in past: Elavil. Sleep hygiene measures: yes. Started any new meds recently: yes, but have not worsened problem. Shift worker: no. New stressors: no, other than holiday stress.  Hypertension: Patient is currently maintained on the following medications for blood pressure: HCTZ Patient reports good compliance with blood pressure medications. Patient denies chest pain, shortness of breath or swelling. Last 3 blood pressure readings in our office are as follows: BP Readings from Last 3 Encounters:  04/05/21 140/70  03/25/21 132/82  03/01/21 (!) 145/87   Anxiety/Depression: Patient complains of prolonged grief.   She has the following symptoms: feelings of sadness.  Onset of symptoms was approximately 1 year ago, She denies current suicidal and homicidal ideation.  Possible organic causes contributing are: none.  Risk factors: negative life event mother's death and previous episode of depression (postpartum). Previous treatment includes none, pt reports she is doing ok without meds or counseling at this time.  Health Maintenance Due  Topic Date Due   Pneumococcal Vaccine 49-96 Years old (1 - PCV) Never done   Hepatitis C Screening  Never done   Zoster Vaccines- Shingrix (1 of 2) Never done   PAP SMEAR-Modifier  05/19/2013   MAMMOGRAM  02/27/2020   COVID-19 Vaccine (5 - Booster for Moderna series) 03/10/2021    Past Medical History:  Diagnosis Date   Allergy    Anemia    age 85-17 . none since   Arthritis    Constipation    GERD (gastroesophageal reflux disease)    Hypertension    no medications for HTN     Past Surgical History:  Procedure Laterality Date    ABDOMINAL HYSTERECTOMY     COLONOSCOPY     1990's done for constipation per pt.    FOOT SURGERY Bilateral    TONSILLECTOMY     TUBAL LIGATION     UPPER GASTROINTESTINAL ENDOSCOPY      Outpatient Medications Prior to Visit  Medication Sig Dispense Refill   acetaminophen (TYLENOL) 500 MG tablet Take 500 mg by mouth every 6 (six) hours as needed.     Ascorbic Acid (VITAMIN C) 1000 MG tablet Take 1,000 mg by mouth daily.     Cholecalciferol (VITAMIN D3) 1.25 MG (50000 UT) TABS Take by mouth.     esomeprazole (NEXIUM) 20 MG capsule Take 20 mg by mouth daily at 12 noon.     fexofenadine (ALLEGRA) 180 MG tablet Take 180 mg by mouth daily.     Misc Natural Products (OSTEO BI-FLEX/5-LOXIN ADVANCED) TABS Take by mouth.     Oxymetazoline HCl (NASAL SPRAY) 0.05 % SOLN Place into the nose.     Specialty Vitamins Products (BIOTIN PLUS KERATIN) 10000-100 MCG-MG TABS Take by mouth.     hydrochlorothiazide (HYDRODIURIL) 12.5 MG tablet Take 1 tablet (12.5 mg total) by mouth in the morning. 30 tablet 3   No facility-administered medications prior to visit.    Allergies  Allergen Reactions   Asa [Aspirin] Other (See Comments)    jittery   Benadryl [Diphenhydramine]    Claritin [Loratadine]    Latex    Sulfa Antibiotics Hives  Objective:    Physical Exam Vitals and nursing note reviewed.  Constitutional:      Appearance: Normal appearance. She is obese.  Cardiovascular:     Rate and Rhythm: Normal rate and regular rhythm.  Pulmonary:     Effort: Pulmonary effort is normal.     Breath sounds: Normal breath sounds.  Musculoskeletal:        General: Normal range of motion.  Skin:    General: Skin is warm and dry.  Neurological:     Mental Status: She is alert.  Psychiatric:        Mood and Affect: Mood normal.        Behavior: Behavior normal.    BP 140/70    Pulse 60    Temp 97.9 F (36.6 C) (Temporal)    Ht 5\' 2"  (1.575 m)    Wt 194 lb 6.4 oz (88.2 kg)    SpO2 97%    BMI  35.56 kg/m  Wt Readings from Last 3 Encounters:  04/05/21 194 lb 6.4 oz (88.2 kg)  03/25/21 194 lb (88 kg)  03/01/21 197 lb 9.6 oz (89.6 kg)       Assessment & Plan:   Problem List Items Addressed This Visit       Cardiovascular and Mediastinum   Essential hypertension - Primary    BP still not at goal, adding Losartan, pt advised on use & SE. F/u in 1 month.      Relevant Medications   losartan (COZAAR) 25 MG tablet   hydrochlorothiazide (HYDRODIURIL) 12.5 MG tablet     Other   Grief reaction with prolonged bereavement    Reports she is doing ok, still refusing meds or counseling referral for now.      Insomnia    Chronic - reports taking elavil in past but didn't help much, but didn't have SE, wants to restart and then slowly increase dose prn - advised pt on how to do this, refill sent. F/u in 1 month.      Relevant Medications   amitriptyline (ELAVIL) 25 MG tablet    Meds ordered this encounter  Medications   amitriptyline (ELAVIL) 25 MG tablet    Sig: Take 1 tablet (25 mg total) by mouth at bedtime.    Dispense:  30 tablet    Refill:  0    Order Specific Question:   Supervising Provider    Answer:   ANDY, CAMILLE L [[id number]]   losartan (COZAAR) 25 MG tablet    Sig: Take 1 tablet (25 mg total) by mouth daily. Ok to take with the HCTZ    Dispense:  30 tablet    Refill:  0    Order Specific Question:   Supervising Provider    Answer:   ANDY, CAMILLE L [[id number]]   hydrochlorothiazide (HYDRODIURIL) 12.5 MG tablet    Sig: Take 1 tablet (12.5 mg total) by mouth in the morning.    Dispense:  90 tablet    Refill:  0    Order Specific Question:   Supervising Provider    Answer:   ANDY, CAMILLE L [[id number]]

## 2021-04-05 NOTE — Assessment & Plan Note (Signed)
BP still not at goal, adding Losartan, pt advised on use & SE. F/u in 1 month.

## 2021-04-05 NOTE — Assessment & Plan Note (Addendum)
Chronic - reports taking elavil in past but didn't help much, but didn't have SE, wants to restart and then slowly increase dose prn - advised pt on how to do this, refill sent. F/u in 1 month.

## 2021-04-14 ENCOUNTER — Ambulatory Visit
Admission: RE | Admit: 2021-04-14 | Discharge: 2021-04-14 | Disposition: A | Discharge status: Home / Self Care | Payer: 59 | Source: Ambulatory Visit | Attending: Family | Admitting: Family

## 2021-04-14 DIAGNOSIS — Z0000 Encounter for general adult medical examination without abnormal findings: Secondary | ICD-10-CM

## 2021-04-22 ENCOUNTER — Other Ambulatory Visit: Payer: Self-pay | Admitting: Family

## 2021-04-22 DIAGNOSIS — N644 Mastodynia: Secondary | ICD-10-CM

## 2021-04-26 ENCOUNTER — Ambulatory Visit (HOSPITAL_BASED_OUTPATIENT_CLINIC_OR_DEPARTMENT_OTHER): Payer: 59

## 2021-05-03 ENCOUNTER — Ambulatory Visit: Payer: 59 | Admitting: Family

## 2021-05-04 ENCOUNTER — Other Ambulatory Visit: Payer: Self-pay

## 2021-05-04 DIAGNOSIS — G4700 Insomnia, unspecified: Secondary | ICD-10-CM

## 2021-05-04 DIAGNOSIS — I10 Essential (primary) hypertension: Secondary | ICD-10-CM

## 2021-05-04 MED ORDER — AMITRIPTYLINE HCL 25 MG PO TABS: 25.0000 mg | ORAL_TABLET | Freq: Every day | ORAL | 0 refills | Status: DC

## 2021-05-04 MED ORDER — LOSARTAN POTASSIUM 25 MG PO TABS: 25.0000 mg | ORAL_TABLET | Freq: Every day | ORAL | 1 refills | Status: DC

## 2021-05-05 ENCOUNTER — Other Ambulatory Visit: Payer: Self-pay

## 2021-05-05 DIAGNOSIS — I10 Essential (primary) hypertension: Secondary | ICD-10-CM

## 2021-05-05 DIAGNOSIS — G4700 Insomnia, unspecified: Secondary | ICD-10-CM

## 2021-05-05 MED ORDER — LOSARTAN POTASSIUM 25 MG PO TABS: 25.0000 mg | ORAL_TABLET | Freq: Every day | ORAL | 1 refills | Status: DC

## 2021-05-05 MED ORDER — AMITRIPTYLINE HCL 25 MG PO TABS: 25.0000 mg | ORAL_TABLET | Freq: Every day | ORAL | 0 refills | Status: DC

## 2021-05-26 ENCOUNTER — Other Ambulatory Visit: Payer: Self-pay

## 2021-05-26 ENCOUNTER — Ambulatory Visit
Admission: RE | Admit: 2021-05-26 | Discharge: 2021-05-26 | Disposition: A | Discharge status: Home / Self Care | Payer: PRIVATE HEALTH INSURANCE | Source: Ambulatory Visit | Attending: Family | Admitting: Family

## 2021-05-26 ENCOUNTER — Other Ambulatory Visit: Payer: Self-pay | Admitting: Family

## 2021-05-26 ENCOUNTER — Ambulatory Visit
Admission: RE | Admit: 2021-05-26 | Discharge: 2021-05-26 | Disposition: A | Discharge status: Home / Self Care | Payer: Self-pay | Source: Ambulatory Visit | Attending: Family | Admitting: Family

## 2021-05-26 DIAGNOSIS — N644 Mastodynia: Secondary | ICD-10-CM

## 2021-05-26 IMAGING — MG DIGITAL DIAGNOSTIC BILAT W/ TOMO W/ CAD
6 of 10 series · 6 of 30 positions shown · non-contrast
Comparison: Previous exam(s).

CLINICAL DATA: 65-year-old female with diffuse, intermittent
bilateral breast pain for several years and intermittent crusting of
the left nipple which has not been seen in the last 4-5 months.



[L MLO synth-2D (1 of 2)]
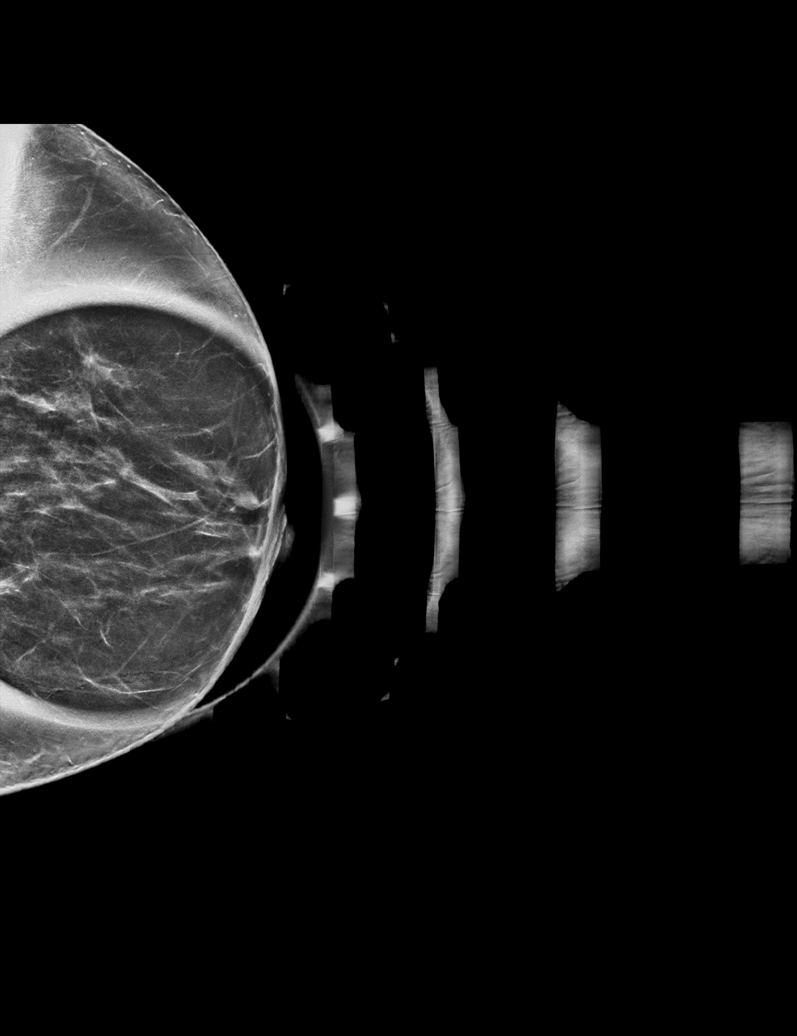

[L CC synth-2D]
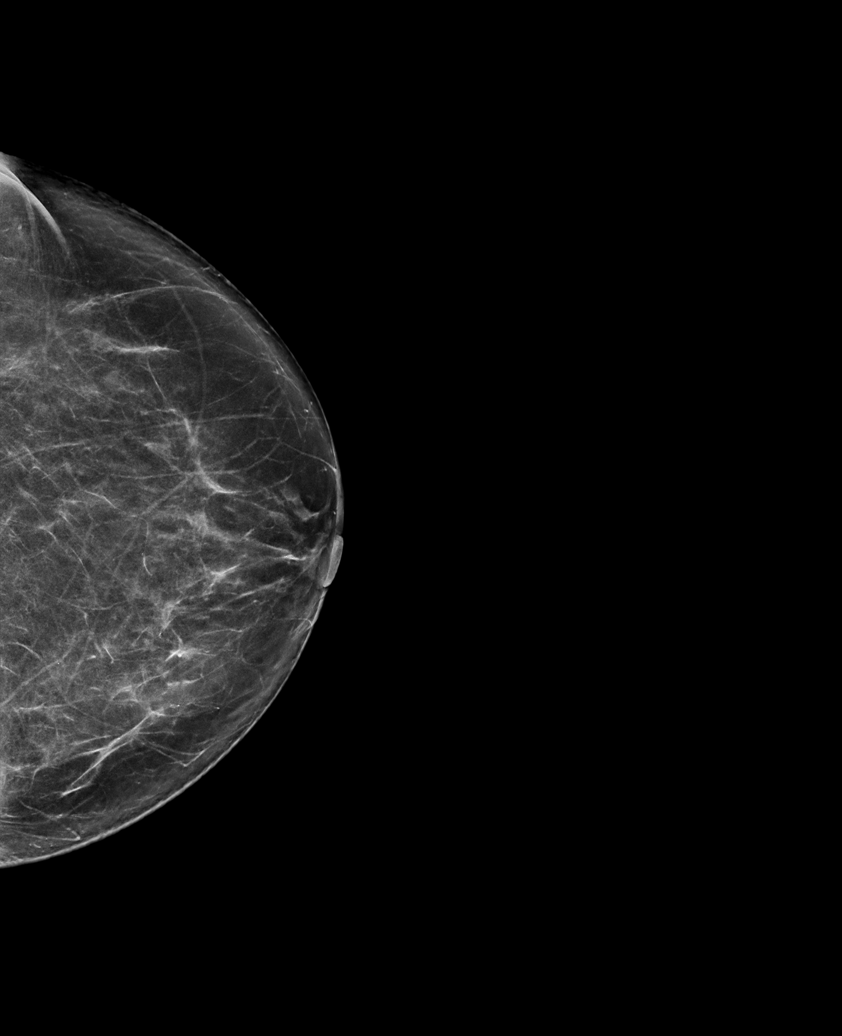

[R MLO synth-2D]
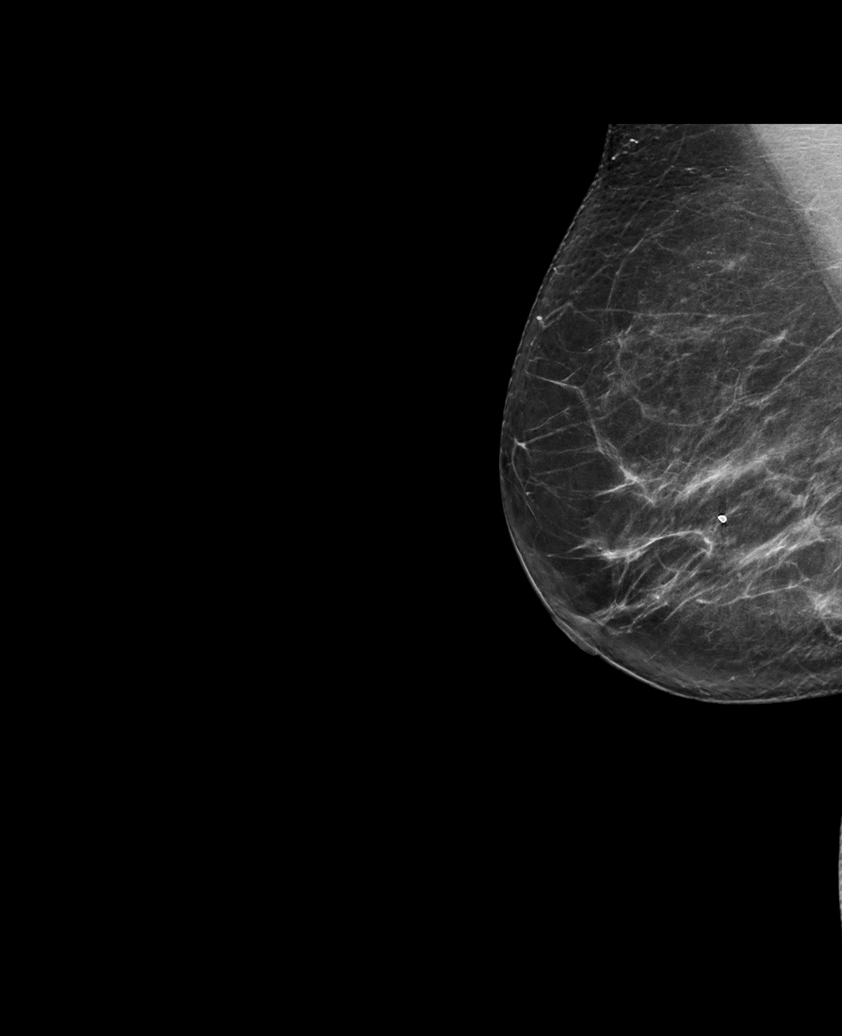

[R CC synth-2D]
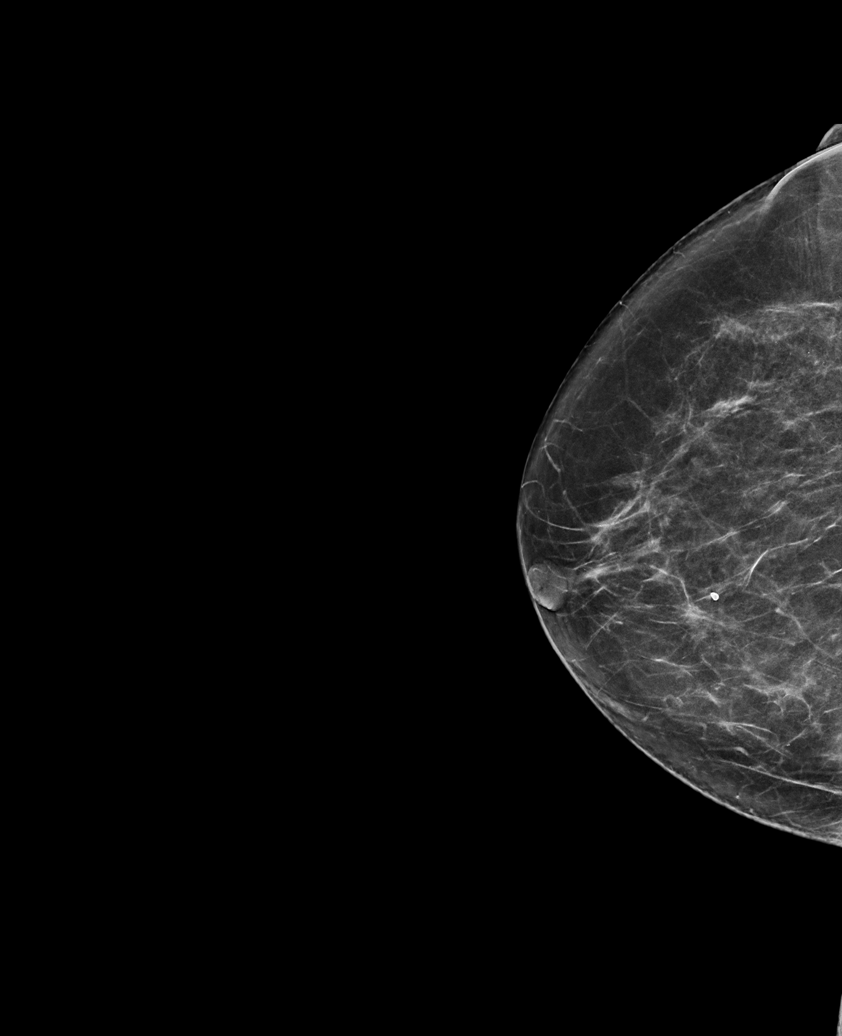

[L MLO synth-2D (2 of 2)]
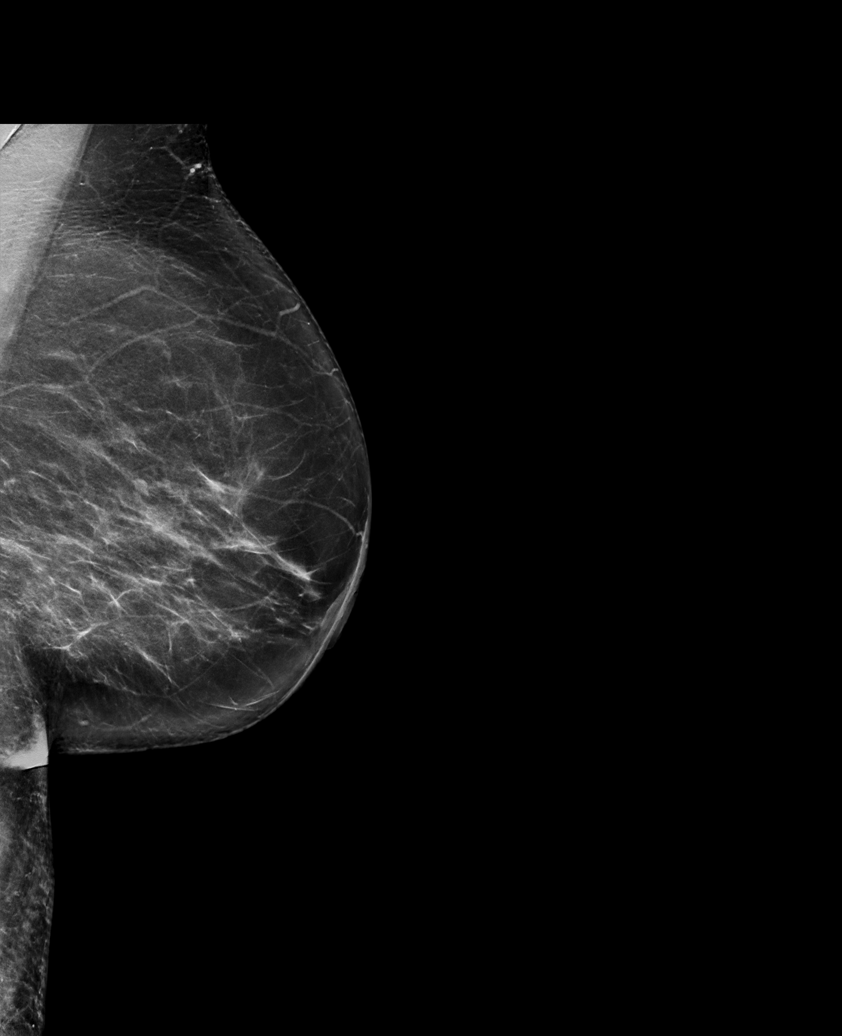

[L MLO tomo · tomo slice 41/81.0]
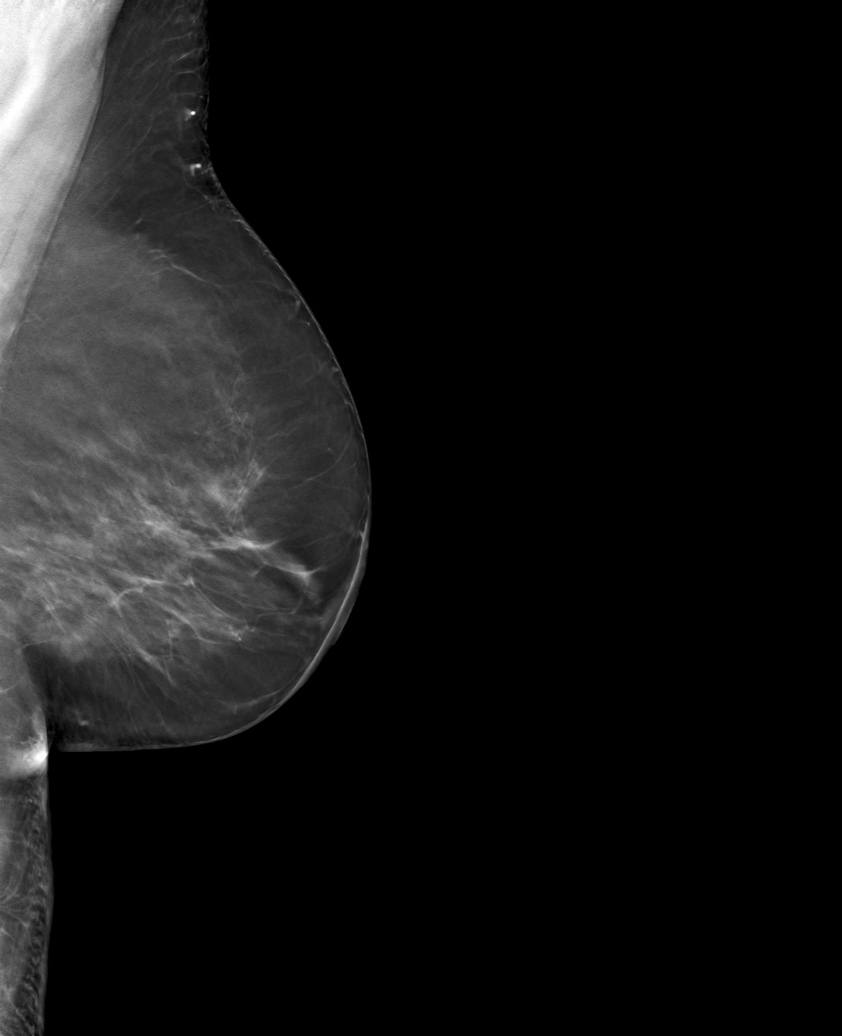

[6 of 30 positions shown; findings below may reference images not displayed]

ACR Breast Density Category c: The breast tissue is heterogeneously
dense, which may obscure small masses.
FINDINGS: Note is made of small, scattered circumscribed equal density masses
bilaterally. Otherwise, no new or suspicious findings in either
breast. The parenchymal pattern is stable.

Targeted ultrasound is performed, showing oval, circumscribed
anechoic and hypoechoic masses within the bilateral breasts
measuring up to 5 mm. These correspond with the patient's bilateral
circumscribed masses. Otherwise no focal or suspicious findings in
either breast.
IMPRESSION: No mammographic or sonographic evidence of malignancy in either
breast.

RECOMMENDATION:
1. Clinical follow-up recommended for the symptomatic area of
concern in the bilateral breasts. Any further workup should be based
on clinical grounds.
2.  Screening mammogram in one year.(Code:[30])

I have discussed the findings and recommendations with the patient.
If applicable, a reminder letter will be sent to the patient
regarding the next appointment.

BI-RADS CATEGORY  2: Benign.

## 2021-06-02 ENCOUNTER — Other Ambulatory Visit: Payer: Self-pay

## 2021-07-27 ENCOUNTER — Other Ambulatory Visit: Payer: Self-pay | Admitting: Family

## 2021-07-27 DIAGNOSIS — G4700 Insomnia, unspecified: Secondary | ICD-10-CM

## 2021-07-27 NOTE — Telephone Encounter (Signed)
Appointment scheduled for 08/09/2021 ?

## 2021-08-09 ENCOUNTER — Ambulatory Visit (INDEPENDENT_AMBULATORY_CARE_PROVIDER_SITE_OTHER): Payer: PRIVATE HEALTH INSURANCE | Admitting: Family

## 2021-08-09 ENCOUNTER — Encounter: Payer: Self-pay | Admitting: Family

## 2021-08-09 ENCOUNTER — Other Ambulatory Visit: Payer: Self-pay

## 2021-08-09 ENCOUNTER — Telehealth: Payer: Self-pay | Admitting: Family

## 2021-08-09 VITALS — BP 138/80 | HR 83 | Temp 98.3°F | Ht 62.0 in | Wt 192.4 lb

## 2021-08-09 DIAGNOSIS — G4700 Insomnia, unspecified: Secondary | ICD-10-CM

## 2021-08-09 DIAGNOSIS — I10 Essential (primary) hypertension: Secondary | ICD-10-CM | POA: Diagnosis not present

## 2021-08-09 DIAGNOSIS — F4329 Adjustment disorder with other symptoms: Secondary | ICD-10-CM

## 2021-08-09 MED ORDER — AMITRIPTYLINE HCL 25 MG PO TABS: 25.0000 mg | ORAL_TABLET | Freq: Every evening | ORAL | 0 refills | Status: DC | PRN

## 2021-08-09 MED ORDER — AMITRIPTYLINE HCL 25 MG PO TABS: 50.0000 mg | ORAL_TABLET | Freq: Every evening | ORAL | 0 refills | Status: DC | PRN

## 2021-08-09 MED ORDER — ESCITALOPRAM OXALATE 5 MG PO TABS: 5.0000 mg | ORAL_TABLET | Freq: Every day | ORAL | 0 refills | Status: DC

## 2021-08-09 MED ORDER — HYDROCHLOROTHIAZIDE 12.5 MG PO TABS: 12.5000 mg | ORAL_TABLET | Freq: Every morning | ORAL | 1 refills | Status: DC

## 2021-08-09 NOTE — Assessment & Plan Note (Signed)
Chronic - unstable - pt increased Elavil to 50mg  with little difference, states doesn't taking qhs, sometimes falls asleep fine, but then wakes during night. Starting to treat her anxiety today, hoping this may benefit her sleep as well, will hold off increasing Elavil again, may have to switch meds d/t serotonin syndrome SE. f/u in 1 mo. ?

## 2021-08-09 NOTE — Telephone Encounter (Signed)
Pharmacy states an error was made internally and Rx sent today (04/24) for the following medication was deleted.  ? ?Please resend as soon as possible. ? ?escitalopram (LEXAPRO) 5 MG tablet [[id number]]  ? ?

## 2021-08-09 NOTE — Assessment & Plan Note (Signed)
Chronic - discussed med options, pt willing to start Lexapro, low dose, advised on use & SE, including possible serotonin syndrome, f/u in 1 mo.  ?

## 2021-08-09 NOTE — Patient Instructions (Addendum)
It was very nice to see you today! ? ?I am sending generic Lexapro, low dose to try and help your mood.  ?For you insomnia continue  the Amitriptyline, but just take 1 pill at night for now while you are starting the Lexapro to avoid a possible interaction.  ?Continue the Losartan and Hydrochlorothiazide daily for your blood pressure.  ?To help your anxiety and prevent depression, try to make social connections with people you have things in common with, hobbies, walking, playing cards, etc.  ? ? ?Schedule a one month follow up. ? ? ? ? ?PLEASE NOTE: ? ?If you had any lab tests please let us know if you have not heard back within a few days. You may see your results on MyChart before we have a chance to review them but we will give you a call once they are reviewed by [place]. If we ordered any referrals today, please let us know if you have not heard from their office within the next week.  ? ?Please try these tips to maintain a healthy lifestyle: ? ?Eat most of your calories during the day when you are active. Eliminate processed foods including packaged sweets (pies, cakes, cookies), reduce intake of potatoes, white bread, white pasta, and white rice. Look for whole grain options, oat flour or almond flour. ? ?Each meal should contain half fruits/vegetables, one quarter protein, and one quarter carbs (no bigger than a computer mouse). ? ?Cut down on sweet beverages. This includes juice, soda, and sweet tea. Also watch fruit intake, though this is a healthier sweet option, it still contains natural sugar! Limit to 3 servings daily. ? ?Drink at least 1 glass of water with each meal and aim for at least 8 glasses per day ? ?Exercise at least 150 minutes every week.  ? ?

## 2021-08-09 NOTE — Telephone Encounter (Signed)
Rx Resent  

## 2021-08-09 NOTE — Assessment & Plan Note (Signed)
Chronic - pt taking Losaratn & HCTZ daily, advised to continue, BP good today, f/u in 92mos. ?

## 2021-08-09 NOTE — Progress Notes (Signed)
? ?Subjective:  ? ? ? Patient ID: Lindsey Suarez, female    DOB: 1956-03-12, 66 y.o.   MRN: [id number] ? ?Chief Complaint  ?Patient presents with  ? Insomnia  ?  Pt states she is able to sleep and sometimes she isn't. When she takes two pills sometimes she's still not able to sleep.  ? Hypertension  ? Anxiety  ?  Discuss treatments  ? ?HPI: ?INSOMNIA:  How long: years. Difficulty initiating sleep: yes.  Difficulty maintaining sleep: no.  OTC meds tried: none. RX meds in past: Elavil. Sleep hygiene measures: yes. Started any new meds recently: yes, but have not worsened problem. Shift worker: no. New stressors: none. pt thinks the Elavil has stopped working. ?Hypertension: Patient is currently maintained on the following medications for blood pressure: HCTZ, Losartan. ?Patient reports good compliance with blood pressure medications. ?Patient denies chest pain, shortness of breath or swelling. ?Anxiety/Depression: Patient complains of prolonged grief.   ?She has the following symptoms: feelings of sadness.  ?Onset of symptoms was approximately 1 year ago, She denies current suicidal and homicidal ideation.  ?Possible organic causes contributing are: none.  ?Risk factors: negative life event mother's death and previous episode of depression (postpartum). ?Previous treatment includes none, pt reports she had been doing ok but now thinks she needs a medication. ? ?  08/09/2021  ? 10:33 AM  ?Depression screen PHQ 2/9  ?Decreased Interest 2  ?Down, Depressed, Hopeless 2  ?PHQ - 2 Score 4  ?Altered sleeping 2  ?Tired, decreased energy 2  ?Change in appetite 3  ?Feeling bad or failure about yourself  0  ?Trouble concentrating 1  ?Moving slowly or fidgety/restless 1  ?Suicidal thoughts 0  ?PHQ-9 Score 13  ?Difficult doing work/chores Somewhat difficult  ? ? ?  08/09/2021  ? 10:31 AM  ?GAD 7 : Generalized Anxiety Score  ?Nervous, Anxious, on Edge 3  ?Control/stop worrying 2  ?Worry too much - different things 2  ?Trouble  relaxing 2  ?Restless 1  ?Easily annoyed or irritable 2  ?Afraid - awful might happen 3  ?Total GAD 7 Score 15  ?Anxiety Difficulty Somewhat difficult  ? ?Health Maintenance Due  ?Topic Date Due  ? Hepatitis C Screening  Never done  ? Zoster Vaccines- Shingrix (1 of 2) Never done  ? COVID-19 Vaccine (5 - Booster for Moderna series) 03/10/2021  ? ?Past Medical History:  ?Diagnosis Date  ? Allergy   ? Anemia   ? age 44-17 . none since  ? Arthritis   ? Chest pain 10/25/2012  ? Chronic cough 09/11/2012  ? Constipation   ? GERD (gastroesophageal reflux disease)   ? Hypertension   ? no medications for HTN   ? Rib fracture 10/25/2012  ? ? ?Past Surgical History:  ?Procedure Laterality Date  ? ABDOMINAL HYSTERECTOMY    ? COLONOSCOPY    ? 1990's done for constipation per pt.   ? FOOT SURGERY Bilateral   ? TONSILLECTOMY    ? TUBAL LIGATION    ? UPPER GASTROINTESTINAL ENDOSCOPY    ? ? ?Outpatient Medications Prior to Visit  ?Medication Sig Dispense Refill  ? acetaminophen (TYLENOL) 500 MG tablet Take 500 mg by mouth every 6 (six) hours as needed.    ? Ascorbic Acid (VITAMIN C) 1000 MG tablet Take 1,000 mg by mouth daily.    ? Cholecalciferol (VITAMIN D3) 1.25 MG (50000 UT) TABS Take by mouth.    ? esomeprazole (NEXIUM) 20 MG capsule Take 20 mg  by mouth daily at 12 noon.    ? fexofenadine (ALLEGRA) 180 MG tablet Take 180 mg by mouth daily.    ? losartan (COZAAR) 25 MG tablet Take 1 tablet (25 mg total) by mouth daily. Ok to take with the HCTZ 90 tablet 1  ? Misc Natural Products (OSTEO BI-FLEX/5-LOXIN ADVANCED) TABS Take by mouth.    ? Oxymetazoline HCl (NASAL SPRAY) 0.05 % SOLN Place into the nose.    ? Specialty Vitamins Products (BIOTIN PLUS KERATIN) 10000-100 MCG-MG TABS Take by mouth.    ? amitriptyline (ELAVIL) 25 MG tablet Take 1 tablet (25 mg total) by mouth at bedtime. 90 tablet 0  ? hydrochlorothiazide (HYDRODIURIL) 12.5 MG tablet Take 1 tablet (12.5 mg total) by mouth in the morning. 90 tablet 0  ? ?No  facility-administered medications prior to visit.  ? ? ?Allergies  ?Allergen Reactions  ? Asa [Aspirin] Other (See Comments)  ?  jittery  ? Benadryl [Diphenhydramine]   ? Claritin [Loratadine]   ? Latex   ? Sulfa Antibiotics Hives  ? ?   ?Objective:  ?  ?Physical Exam ?Vitals and nursing note reviewed.  ?Constitutional:   ?   Appearance: Normal appearance.  ?Cardiovascular:  ?   Rate and Rhythm: Normal rate and regular rhythm.  ?Pulmonary:  ?   Effort: Pulmonary effort is normal.  ?   Breath sounds: Normal breath sounds.  ?Musculoskeletal:     ?   General: Normal range of motion.  ?Skin: ?   General: Skin is warm and dry.  ?Neurological:  ?   Mental Status: She is alert.  ?Psychiatric:     ?   Mood and Affect: Mood normal.     ?   Behavior: Behavior normal.  ? ? ?BP 138/80 (BP Location: Left Arm, Patient Position: Sitting, Cuff Size: Large)   Pulse 83   Temp 98.3 ?F (36.8 ?C) (Temporal)   Ht 5\' 2"  (1.575 m)   Wt 192 lb 6 oz (87.3 kg)   SpO2 97%   BMI 35.19 kg/m?  ?Wt Readings from Last 3 Encounters:  ?08/09/21 192 lb 6 oz (87.3 kg)  ?04/05/21 194 lb 6.4 oz (88.2 kg)  ?03/25/21 194 lb (88 kg)  ? ? ?   ?Assessment & Plan:  ? ?Problem List Items Addressed This Visit   ? ?  ? Cardiovascular and Mediastinum  ? Essential hypertension  ?  Chronic - pt taking Losaratn & HCTZ daily, advised to continue, BP good today, f/u in 3mos. ? ?  ?  ? Relevant Medications  ? hydrochlorothiazide (HYDRODIURIL) 12.5 MG tablet  ?  ? Other  ? Grief reaction with prolonged bereavement  ?  Chronic - discussed med options, pt willing to start Lexapro, low dose, advised on use & SE, including possible serotonin syndrome, f/u in 1 mo.  ? ?  ?  ? Relevant Medications  ? escitalopram (LEXAPRO) 5 MG tablet  ? Insomnia - Primary  ?  Chronic - unstable - pt increased Elavil to 50mg  with little difference, states doesn't taking qhs, sometimes falls asleep fine, but then wakes during night. Starting to treat her anxiety today, hoping this may  benefit her sleep as well, will hold off increasing Elavil again, may have to switch meds d/t serotonin syndrome SE. f/u in 1 mo. ? ?  ?  ? Relevant Medications  ? amitriptyline (ELAVIL) 25 MG tablet  ? ? ?Meds ordered this encounter  ?Medications  ? hydrochlorothiazide (HYDRODIURIL) 12.5 MG tablet  ?  Sig: Take 1 tablet (12.5 mg total) by mouth in the morning.  ?  Dispense:  90 tablet  ?  Refill:  1  ?  Order Specific Question:   Supervising Provider  ?  Answer:   ANDY, CAMILLE L [[id number]]  ? DISCONTD: amitriptyline (ELAVIL) 25 MG tablet  ?  Sig: Take 2-3 tablets (50-75 mg total) by mouth at bedtime as needed for sleep.  ?  Dispense:  180 tablet  ?  Refill:  0  ?  Order Specific Question:   Supervising Provider  ?  Answer:   ANDY, CAMILLE L [[id number]]  ? escitalopram (LEXAPRO) 5 MG tablet  ?  Sig: Take 1 tablet (5 mg total) by mouth daily.  ?  Dispense:  30 tablet  ?  Refill:  0  ?  Order Specific Question:   Supervising Provider  ?  Answer:   ANDY, CAMILLE L [[id number]]  ? amitriptyline (ELAVIL) 25 MG tablet  ?  Sig: Take 1-2 tablets (25-50 mg total) by mouth at bedtime as needed for sleep.  ?  Dispense:  180 tablet  ?  Refill:  0  ?  IGNORE previous RX just sent  ?  Order Specific Question:   Supervising Provider  ?  Answer:   ANDY, CAMILLE L [[id number]]  ? ? ?Dulce Sellar, NP ? ?

## 2021-09-03 ENCOUNTER — Other Ambulatory Visit: Payer: Self-pay | Admitting: Family

## 2021-09-03 DIAGNOSIS — F4329 Adjustment disorder with other symptoms: Secondary | ICD-10-CM

## 2021-09-06 ENCOUNTER — Encounter: Payer: Self-pay | Admitting: Family

## 2021-09-06 ENCOUNTER — Ambulatory Visit (INDEPENDENT_AMBULATORY_CARE_PROVIDER_SITE_OTHER): Payer: PRIVATE HEALTH INSURANCE | Admitting: Family

## 2021-09-06 DIAGNOSIS — F4329 Adjustment disorder with other symptoms: Secondary | ICD-10-CM | POA: Diagnosis not present

## 2021-09-06 DIAGNOSIS — I10 Essential (primary) hypertension: Secondary | ICD-10-CM

## 2021-09-06 MED ORDER — LOSARTAN POTASSIUM 25 MG PO TABS: 25.0000 mg | ORAL_TABLET | Freq: Every day | ORAL | 2 refills | Status: DC

## 2021-09-06 MED ORDER — ESCITALOPRAM OXALATE 10 MG PO TABS: 10.0000 mg | ORAL_TABLET | Freq: Every day | ORAL | 2 refills | Status: DC

## 2021-09-06 MED ORDER — HYDROCHLOROTHIAZIDE 12.5 MG PO TABS: 12.5000 mg | ORAL_TABLET | Freq: Every morning | ORAL | 2 refills | Status: DC

## 2021-09-06 NOTE — Assessment & Plan Note (Signed)
Chronic- sending refills today, f/u in 2 mos.

## 2021-09-06 NOTE — Progress Notes (Signed)
Subjective:     Patient ID: Lindsey Suarez, female    DOB: 06-27-1955, 66 y.o.   MRN: [id number]  Chief Complaint  Patient presents with  . Follow-up    Anxiety/Depression   HPI: Hypertension: Patient is currently maintained on the following medications for blood pressure: Losartan, HCTZ Failed meds include: none Patient reports good compliance with blood pressure medications. Patient denies chest pain, headaches, shortness of breath or swelling. Last 3 blood pressure readings in our office are as follows: BP Readings from Last 3 Encounters:  09/06/21 122/78  08/09/21 138/80  04/05/21 140/70  Anxiety/Depression: Patient complains of prolonged grief.   She has the following symptoms: feelings of sadness.  Onset of symptoms was approximately 1 year ago, She denies current suicidal and homicidal ideation.  Possible organic causes contributing are: none.  Risk factors: negative life event mother's death and previous episode of depression (postpartum). Previous treatment includes none, pt reports she had been doing ok but now thinks she needs a medication. Assessment & Plan:   Problem List Items Addressed This Visit       Cardiovascular and Mediastinum   Essential hypertension   Relevant Medications   hydrochlorothiazide (HYDRODIURIL) 12.5 MG tablet   losartan (COZAAR) 25 MG tablet     Other   Grief reaction with prolonged bereavement    Chronic- started Lexapro last visit - reports no SE, did ok over mother's day, but would like to increase to 10mg . f/u in 2 months.       Relevant Medications   escitalopram (LEXAPRO) 10 MG tablet    Outpatient Medications Prior to Visit  Medication Sig Dispense Refill  . acetaminophen (TYLENOL) 500 MG tablet Take 500 mg by mouth every 6 (six) hours as needed.    amitriptyline (ELAVIL) 25 MG tablet Take 1-2 tablets (25-50 mg total) by mouth at bedtime as needed for sleep. 180 tablet 0  . Ascorbic Acid (VITAMIN C) 1000 MG tablet  Take 1,000 mg by mouth daily.    . Cholecalciferol (VITAMIN D3) 1.25 MG (50000 UT) TABS Take by mouth.    . esomeprazole (NEXIUM) 20 MG capsule Take 20 mg by mouth daily at 12 noon.    . fexofenadine (ALLEGRA) 180 MG tablet Take 180 mg by mouth daily.    . Misc Natural Products (OSTEO BI-FLEX/5-LOXIN ADVANCED) TABS Take by mouth.    . Oxymetazoline HCl (NASAL SPRAY) 0.05 % SOLN Place into the nose.    [facility] Specialty Vitamins Products (BIOTIN PLUS KERATIN) 10000-100 MCG-MG TABS Take by mouth.    . escitalopram (LEXAPRO) 5 MG tablet TAKE 1 TABLET(5 MG) BY MOUTH DAILY 30 tablet 0  . hydrochlorothiazide (HYDRODIURIL) 12.5 MG tablet Take 1 tablet (12.5 mg total) by mouth in the morning. 90 tablet 1  . losartan (COZAAR) 25 MG tablet Take 1 tablet (25 mg total) by mouth daily. Ok to take with the HCTZ 90 tablet 1   No facility-administered medications prior to visit.    Past Medical History:  Diagnosis Date  . Allergy   . Anemia    age 75-17 . none since  . Arthritis   . Chest pain 10/25/2012  . Chronic cough 09/11/2012  . Constipation   . GERD (gastroesophageal reflux disease)   . Hypertension    no medications for HTN   . Peripheral edema 09/11/2012  . Rib fracture 10/25/2012    Past Surgical History:  Procedure Laterality Date  . ABDOMINAL HYSTERECTOMY    . COLONOSCOPY  1990's done for constipation per pt.   [facility] FOOT SURGERY Bilateral   . TONSILLECTOMY    . TUBAL LIGATION    . UPPER GASTROINTESTINAL ENDOSCOPY      Allergies  Allergen Reactions  . Asa [Aspirin] Other (See Comments)    jittery  . Benadryl [Diphenhydramine]   . Claritin [Loratadine]   . Latex   . Sulfa Antibiotics Hives       Objective:    Physical Exam Vitals and nursing note reviewed.  Constitutional:      Appearance: Normal appearance.  Cardiovascular:     Rate and Rhythm: Normal rate and regular rhythm.  Pulmonary:     Effort: Pulmonary effort is normal.     Breath sounds: Normal breath sounds.   Musculoskeletal:        General: Normal range of motion.  Skin:    General: Skin is warm and dry.  Neurological:     Mental Status: She is alert.  Psychiatric:        Mood and Affect: Mood normal.        Behavior: Behavior normal.    BP 122/78 (BP Location: Left Arm, Patient Position: Sitting, Cuff Size: Large)   Pulse 64   Temp 98.3 F (36.8 C) (Temporal)   Ht 5\' 2"  (1.575 m)   Wt 191 lb 8 oz (86.9 kg)   SpO2 93%   BMI 35.03 kg/m  Wt Readings from Last 3 Encounters:  09/06/21 191 lb 8 oz (86.9 kg)  08/09/21 192 lb 6 oz (87.3 kg)  04/05/21 194 lb 6.4 oz (88.2 kg)        Meds ordered this encounter  Medications  . hydrochlorothiazide (HYDRODIURIL) 12.5 MG tablet    Sig: Take 1 tablet (12.5 mg total) by mouth in the morning.    Dispense:  30 tablet    Refill:  2    Order Specific Question:   Supervising Provider    Answer:   ANDY, CAMILLE L [[id number]]  . losartan (COZAAR) 25 MG tablet    Sig: Take 1 tablet (25 mg total) by mouth daily. Ok to take with the HCTZ    Dispense:  30 tablet    Refill:  2    Order Specific Question:   Supervising Provider    Answer:   ANDY, CAMILLE L [[id number]]  . escitalopram (LEXAPRO) 10 MG tablet    Sig: Take 1 tablet (10 mg total) by mouth daily.    Dispense:  30 tablet    Refill:  2    Order Specific Question:   Supervising Provider    Answer:   ANDY, CAMILLE L [[id number]]    04/07/21, NP

## 2021-09-06 NOTE — Assessment & Plan Note (Addendum)
Chronic- started Lexapro last visit - reports no SE, did ok over mother's day, but would like to increase to 10mg . f/u in 2 months.

## 2021-09-06 NOTE — Patient Instructions (Signed)
It was very nice to see you today!  I have sent in a higher dose of the Lexapro. You can take 2 pills of your 5mg  dose until finished.  I have sent refills for your Losartan and Hydrochlorothiazide.  Follow up in 2 months.  Have a great week!      PLEASE NOTE:  If you had any lab tests please let know if you have not heard back within a few days. You may see your results on MyChart before we have a chance to review them but we will give you a call once they are reviewed by [place]. If we ordered any referrals today, please let us know if you have not heard from their office within the next week.   Please try these tips to maintain a healthy lifestyle:  Eat most of your calories during the day when you are active. Eliminate processed foods including packaged sweets (pies, cakes, cookies), reduce intake of potatoes, white bread, white pasta, and white rice. Look for whole grain options, oat flour or almond flour.  Each meal should contain half fruits/vegetables, one quarter protein, and one quarter carbs (no bigger than a computer mouse).  Cut down on sweet beverages. This includes juice, soda, and sweet tea. Also watch fruit intake, though this is a healthier sweet option, it still contains natural sugar! Limit to 3 servings daily.  Drink at least 1 glass of water with each meal and aim for at least 8 glasses per day  Exercise at least 150 minutes every week.

## 2021-10-31 ENCOUNTER — Other Ambulatory Visit: Payer: Self-pay | Admitting: Family

## 2021-10-31 DIAGNOSIS — G4700 Insomnia, unspecified: Secondary | ICD-10-CM

## 2021-11-08 ENCOUNTER — Ambulatory Visit: Payer: PRIVATE HEALTH INSURANCE | Admitting: Family

## 2021-11-15 ENCOUNTER — Ambulatory Visit (INDEPENDENT_AMBULATORY_CARE_PROVIDER_SITE_OTHER): Payer: 59 | Admitting: Family

## 2021-11-15 ENCOUNTER — Encounter: Payer: Self-pay | Admitting: Family

## 2021-11-15 DIAGNOSIS — F4329 Adjustment disorder with other symptoms: Secondary | ICD-10-CM | POA: Diagnosis not present

## 2021-11-15 DIAGNOSIS — I10 Essential (primary) hypertension: Secondary | ICD-10-CM | POA: Diagnosis not present

## 2021-11-15 DIAGNOSIS — G4700 Insomnia, unspecified: Secondary | ICD-10-CM | POA: Diagnosis not present

## 2021-11-15 MED ORDER — AMITRIPTYLINE HCL 25 MG PO TABS: 50.0000 mg | ORAL_TABLET | Freq: Every evening | ORAL | 1 refills | Status: DC | PRN

## 2021-11-15 MED ORDER — LOSARTAN POTASSIUM 25 MG PO TABS: 25.0000 mg | ORAL_TABLET | Freq: Every day | ORAL | 1 refills | Status: DC

## 2021-11-15 MED ORDER — ESCITALOPRAM OXALATE 10 MG PO TABS: 10.0000 mg | ORAL_TABLET | Freq: Every day | ORAL | 1 refills | Status: DC

## 2021-11-15 MED ORDER — HYDROCHLOROTHIAZIDE 12.5 MG PO TABS: 12.5000 mg | ORAL_TABLET | Freq: Every morning | ORAL | 1 refills | Status: AC

## 2021-11-15 NOTE — Assessment & Plan Note (Signed)
   chronic  stable on Losartan & HCTZ qam  sending refills today  f/u in 3 mos with labs

## 2021-11-15 NOTE — Assessment & Plan Note (Signed)
   chronic  reduced Elavil d/t starting Lexapro  pt denies any serotonin syn. sx  ok to increase Elavil to 50mg  qhs  f/u 3 mos or prn

## 2021-11-15 NOTE — Patient Instructions (Addendum)
It was very nice to see you today!   Refills have been sent to your pharmacy, 90 pills for 90 days.  Go ahead and increase your Amityptyline to 2 pills at bedtime.  Schedule a 3 month follow up appointment.       PLEASE NOTE:  If you had any lab tests please let us know if you have not heard back within a few days. You may see your results on MyChart before we have a chance to review them but we will give you a call once they are reviewed by [place]. If we ordered any referrals today, please let us know if you have not heard from their office within the next week.

## 2021-11-15 NOTE — Assessment & Plan Note (Signed)
   chronic  doing a little better on Lexapro 10mg , but not sleeping well  continuing same dose Lexapro  adjusting Elavil  f/u in 3 mos

## 2021-11-15 NOTE — Progress Notes (Signed)
Patient ID: Lindsey Suarez, female    DOB: 1956-03-30, 66 y.o.   MRN: [id number]  Chief Complaint  Patient presents with   Mood    Follow up Anxiety and Depression, Pt states she is not able to go to sleep some nights. Pt has been more depressed lately, Due to not being able to get over the loss of her mom. Pt states she don't think she can get over it.      HPI: Hypertension: Patient is currently maintained on the following medications for blood pressure: Losartan, HCTZ Patient reports good compliance with blood pressure medications. Patient denies chest pain, headaches, shortness of breath or swelling. Last 3 blood pressure readings in our office are as follows: BP Readings from Last 3 Encounters:  11/15/21 138/84  09/06/21 122/78  08/09/21 138/80  Anxiety/Depression: Patient complains of prolonged grief.   She has the following symptoms: feelings of sadness.  Onset of symptoms was approximately 1 year ago, She denies current suicidal and homicidal ideation. Risk factors: negative life event mother's death and previous episode of depression (postpartum). Lexapro increased to 10mg  last visit, pt doing ok, reports still having some social anxiety. INSOMNIA:  How long: years. Difficulty initiating sleep: yes.  Difficulty maintaining sleep: no.  OTC meds tried: none. RX meds in past: Elavil. Sleep hygiene measures: yes. Started any new meds recently: yes, but have not worsened problem. Shift worker: no. New stressors: none. Pt reports having trouble sleeping now on just the 25mg  of Elavil.  Assessment & Plan:   Problem List Items Addressed This Visit       Cardiovascular and Mediastinum   Essential hypertension    chronic stable on Losartan & HCTZ qam sending refills today f/u in 3 mos with labs      Relevant Medications   hydrochlorothiazide (HYDRODIURIL) 12.5 MG tablet   losartan (COZAAR) 25 MG tablet     Other   Grief reaction with prolonged bereavement    chronic doing  a little better on Lexapro 10mg , but not sleeping well continuing same dose Lexapro adjusting Elavil f/u in 3 mos      Relevant Medications   escitalopram (LEXAPRO) 10 MG tablet   Insomnia    chronic reduced Elavil d/t starting Lexapro pt denies any serotonin syn. sx ok to increase Elavil to 50mg  qhs f/u 3 mos or prn      Relevant Medications   amitriptyline (ELAVIL) 25 MG tablet    Subjective:    Outpatient Medications Prior to Visit  Medication Sig Dispense Refill   acetaminophen (TYLENOL) 500 MG tablet Take 500 mg by mouth every 6 (six) hours as needed.     Ascorbic Acid (VITAMIN C) 1000 MG tablet Take 1,000 mg by mouth daily.     Cholecalciferol (VITAMIN D3) 1.25 MG (50000 UT) TABS Take by mouth.     esomeprazole (NEXIUM) 20 MG capsule Take 20 mg by mouth daily at 12 noon.     fexofenadine (ALLEGRA) 180 MG tablet Take 180 mg by mouth daily.     Misc Natural Products (OSTEO BI-FLEX/5-LOXIN ADVANCED) TABS Take by mouth.     Oxymetazoline HCl (NASAL SPRAY) 0.05 % SOLN Place into the nose.     Specialty Vitamins Products (BIOTIN PLUS KERATIN) 10000-100 MCG-MG TABS Take by mouth.     amitriptyline (ELAVIL) 25 MG tablet Take 1-2 tablets (25-50 mg total) by mouth at bedtime as needed for sleep. 180 tablet 0   escitalopram (LEXAPRO) 10 MG tablet Take 1  tablet (10 mg total) by mouth daily. 30 tablet 2   hydrochlorothiazide (HYDRODIURIL) 12.5 MG tablet Take 1 tablet (12.5 mg total) by mouth in the morning. 30 tablet 2   losartan (COZAAR) 25 MG tablet Take 1 tablet (25 mg total) by mouth daily. Ok to take with the HCTZ 30 tablet 2   No facility-administered medications prior to visit.   Past Medical History:  Diagnosis Date   Allergy    Anemia    age [age] . none since   Arthritis    Chest pain 10/25/2012   Chronic cough 09/11/2012   Constipation    GERD (gastroesophageal reflux disease)    Hypertension    no medications for HTN    Peripheral edema 09/11/2012   Rib fracture  10/25/2012   Past Surgical History:  Procedure Laterality Date   ABDOMINAL HYSTERECTOMY     COLONOSCOPY     1990's done for constipation per pt.    FOOT SURGERY Bilateral    TONSILLECTOMY     TUBAL LIGATION     UPPER GASTROINTESTINAL ENDOSCOPY     Allergies  Allergen Reactions   Asa [Aspirin] Other (See Comments)    jittery   Benadryl [Diphenhydramine]    Claritin [Loratadine]    Latex    Sulfa Antibiotics Hives      Objective:    Physical Exam Vitals and nursing note reviewed.  Constitutional:      Appearance: Normal appearance.  Cardiovascular:     Rate and Rhythm: Normal rate and regular rhythm.  Pulmonary:     Effort: Pulmonary effort is normal.     Breath sounds: Normal breath sounds.  Musculoskeletal:        General: Normal range of motion.  Skin:    General: Skin is warm and dry.  Neurological:     Mental Status: She is alert.  Psychiatric:        Mood and Affect: Mood normal.        Behavior: Behavior normal.    BP 138/84 (BP Location: Left Arm, Patient Position: Sitting, Cuff Size: Large)   Pulse 67   Temp 97.6 F (36.4 C) (Temporal)   Ht 5\' 2"  (1.575 m)   Wt 184 lb 6 oz (83.6 kg)   SpO2 95%   BMI 33.72 kg/m  Wt Readings from Last 3 Encounters:  11/15/21 184 lb 6 oz (83.6 kg)  09/06/21 191 lb 8 oz (86.9 kg)  08/09/21 192 lb 6 oz (87.3 kg)      08/11/21, NP

## 2022-01-10 ENCOUNTER — Encounter: Payer: Self-pay | Admitting: *Deleted

## 2022-03-31 ENCOUNTER — Encounter: Payer: Self-pay | Admitting: *Deleted

## 2022-05-08 ENCOUNTER — Other Ambulatory Visit: Payer: Self-pay | Admitting: Family

## 2022-05-08 DIAGNOSIS — G4700 Insomnia, unspecified: Secondary | ICD-10-CM

## 2022-07-07 ENCOUNTER — Other Ambulatory Visit: Payer: Self-pay | Admitting: Family

## 2022-07-07 DIAGNOSIS — F4329 Adjustment disorder with other symptoms: Secondary | ICD-10-CM

## 2022-08-08 ENCOUNTER — Other Ambulatory Visit: Payer: Self-pay | Admitting: Family

## 2022-08-08 DIAGNOSIS — I10 Essential (primary) hypertension: Secondary | ICD-10-CM

## 2022-08-08 DIAGNOSIS — F4329 Adjustment disorder with other symptoms: Secondary | ICD-10-CM

## 2022-08-08 DIAGNOSIS — G4700 Insomnia, unspecified: Secondary | ICD-10-CM

## 2022-09-16 ENCOUNTER — Other Ambulatory Visit: Payer: Self-pay | Admitting: Family

## 2022-09-16 DIAGNOSIS — F4329 Adjustment disorder with other symptoms: Secondary | ICD-10-CM

## 2022-11-07 ENCOUNTER — Other Ambulatory Visit: Payer: Self-pay | Admitting: Nurse Practitioner

## 2022-11-07 DIAGNOSIS — Z1231 Encounter for screening mammogram for malignant neoplasm of breast: Secondary | ICD-10-CM

## 2022-11-24 ENCOUNTER — Ambulatory Visit: Payer: Medicare HMO

## 2022-11-30 ENCOUNTER — Ambulatory Visit: Payer: Medicare HMO

## 2022-12-15 ENCOUNTER — Ambulatory Visit: Payer: Medicare HMO

## 2023-01-27 ENCOUNTER — Ambulatory Visit
Admission: RE | Admit: 2023-01-27 | Discharge: 2023-01-27 | Disposition: A | Discharge status: Home / Self Care | Payer: Medicare HMO | Source: Ambulatory Visit | Attending: Nurse Practitioner | Admitting: Nurse Practitioner

## 2023-01-27 ENCOUNTER — Other Ambulatory Visit: Payer: Self-pay | Admitting: Nurse Practitioner

## 2023-01-27 DIAGNOSIS — R0602 Shortness of breath: Secondary | ICD-10-CM

## 2023-03-30 ENCOUNTER — Ambulatory Visit
Admission: RE | Admit: 2023-03-30 | Discharge: 2023-03-30 | Disposition: A | Discharge status: Home / Self Care | Payer: Medicare HMO | Source: Ambulatory Visit | Attending: Nurse Practitioner | Admitting: Nurse Practitioner

## 2023-03-30 DIAGNOSIS — Z1231 Encounter for screening mammogram for malignant neoplasm of breast: Secondary | ICD-10-CM
